# Patient Record
Sex: Male | Born: 1955 | Race: Black or African American | Hispanic: No | Marital: Single | Smoking: Former smoker
Health system: Southern US, Community
[De-identification: ages and names within clinical notes are randomized; demographics above are authoritative.]

## PROBLEM LIST (undated history)

## (undated) DIAGNOSIS — M12819 Other specific arthropathies, not elsewhere classified, unspecified shoulder: Secondary | ICD-10-CM

## (undated) DIAGNOSIS — I10 Essential (primary) hypertension: Secondary | ICD-10-CM

## (undated) DIAGNOSIS — K449 Diaphragmatic hernia without obstruction or gangrene: Secondary | ICD-10-CM

## (undated) DIAGNOSIS — R079 Chest pain, unspecified: Secondary | ICD-10-CM

## (undated) DIAGNOSIS — M1990 Unspecified osteoarthritis, unspecified site: Secondary | ICD-10-CM

## (undated) DIAGNOSIS — Z87442 Personal history of urinary calculi: Secondary | ICD-10-CM

## (undated) DIAGNOSIS — E119 Type 2 diabetes mellitus without complications: Secondary | ICD-10-CM

## (undated) DIAGNOSIS — E875 Hyperkalemia: Secondary | ICD-10-CM

## (undated) DIAGNOSIS — M25519 Pain in unspecified shoulder: Secondary | ICD-10-CM

## (undated) DIAGNOSIS — G252 Other specified forms of tremor: Secondary | ICD-10-CM

## (undated) DIAGNOSIS — J45909 Unspecified asthma, uncomplicated: Secondary | ICD-10-CM

## (undated) DIAGNOSIS — M75100 Unspecified rotator cuff tear or rupture of unspecified shoulder, not specified as traumatic: Secondary | ICD-10-CM

## (undated) DIAGNOSIS — I219 Acute myocardial infarction, unspecified: Secondary | ICD-10-CM

## (undated) DIAGNOSIS — K219 Gastro-esophageal reflux disease without esophagitis: Secondary | ICD-10-CM

## (undated) DIAGNOSIS — N189 Chronic kidney disease, unspecified: Secondary | ICD-10-CM

## (undated) HISTORY — PX: WISDOM TOOTH EXTRACTION: SHX21

## (undated) HISTORY — PX: CARDIAC CATHETERIZATION: SHX172

## (undated) HISTORY — DX: Chest pain, unspecified: R07.9

## (undated) HISTORY — DX: Other specific arthropathies, not elsewhere classified, unspecified shoulder: M12.819

## (undated) HISTORY — DX: Diaphragmatic hernia without obstruction or gangrene: K44.9

## (undated) HISTORY — DX: Pain in unspecified shoulder: M25.519

## (undated) HISTORY — DX: Unspecified rotator cuff tear or rupture of unspecified shoulder, not specified as traumatic: M75.100

## (undated) HISTORY — DX: Hyperkalemia: E87.5

## (undated) HISTORY — PX: HERNIA REPAIR: SHX51

## (undated) HISTORY — DX: Unspecified osteoarthritis, unspecified site: M19.90

## (undated) HISTORY — PX: ANKLE SURGERY: SHX546

## (undated) HISTORY — DX: Type 2 diabetes mellitus without complications: E11.9

---

## 2019-06-18 ENCOUNTER — Other Ambulatory Visit: Payer: Self-pay

## 2019-06-18 DIAGNOSIS — Z20822 Contact with and (suspected) exposure to covid-19: Secondary | ICD-10-CM

## 2019-06-25 LAB — NOVEL CORONAVIRUS, NAA: SARS-CoV-2, NAA: NOT DETECTED

## 2019-09-25 ENCOUNTER — Other Ambulatory Visit: Payer: Self-pay

## 2019-09-25 DIAGNOSIS — Z20822 Contact with and (suspected) exposure to covid-19: Secondary | ICD-10-CM

## 2019-09-27 LAB — NOVEL CORONAVIRUS, NAA: SARS-CoV-2, NAA: NOT DETECTED

## 2021-01-05 DIAGNOSIS — E669 Obesity, unspecified: Secondary | ICD-10-CM | POA: Diagnosis not present

## 2021-01-05 DIAGNOSIS — N529 Male erectile dysfunction, unspecified: Secondary | ICD-10-CM | POA: Diagnosis not present

## 2021-01-05 DIAGNOSIS — Z6830 Body mass index (BMI) 30.0-30.9, adult: Secondary | ICD-10-CM | POA: Diagnosis not present

## 2021-01-05 DIAGNOSIS — E7800 Pure hypercholesterolemia, unspecified: Secondary | ICD-10-CM | POA: Diagnosis not present

## 2021-01-05 DIAGNOSIS — E119 Type 2 diabetes mellitus without complications: Secondary | ICD-10-CM | POA: Diagnosis not present

## 2021-01-05 DIAGNOSIS — G252 Other specified forms of tremor: Secondary | ICD-10-CM | POA: Diagnosis not present

## 2021-01-05 DIAGNOSIS — R351 Nocturia: Secondary | ICD-10-CM | POA: Diagnosis not present

## 2021-01-05 DIAGNOSIS — I10 Essential (primary) hypertension: Secondary | ICD-10-CM | POA: Diagnosis not present

## 2021-01-05 DIAGNOSIS — K219 Gastro-esophageal reflux disease without esophagitis: Secondary | ICD-10-CM | POA: Diagnosis not present

## 2021-08-11 DIAGNOSIS — E119 Type 2 diabetes mellitus without complications: Secondary | ICD-10-CM | POA: Diagnosis not present

## 2021-08-11 DIAGNOSIS — H524 Presbyopia: Secondary | ICD-10-CM | POA: Diagnosis not present

## 2021-08-11 DIAGNOSIS — H01004 Unspecified blepharitis left upper eyelid: Secondary | ICD-10-CM | POA: Diagnosis not present

## 2021-08-11 DIAGNOSIS — H25013 Cortical age-related cataract, bilateral: Secondary | ICD-10-CM | POA: Diagnosis not present

## 2021-08-11 DIAGNOSIS — H18413 Arcus senilis, bilateral: Secondary | ICD-10-CM | POA: Diagnosis not present

## 2021-08-11 DIAGNOSIS — H01001 Unspecified blepharitis right upper eyelid: Secondary | ICD-10-CM | POA: Diagnosis not present

## 2021-08-11 DIAGNOSIS — H2513 Age-related nuclear cataract, bilateral: Secondary | ICD-10-CM | POA: Diagnosis not present

## 2021-08-17 DIAGNOSIS — K219 Gastro-esophageal reflux disease without esophagitis: Secondary | ICD-10-CM | POA: Diagnosis not present

## 2021-08-17 DIAGNOSIS — I10 Essential (primary) hypertension: Secondary | ICD-10-CM | POA: Diagnosis not present

## 2021-08-17 DIAGNOSIS — M4800 Spinal stenosis, site unspecified: Secondary | ICD-10-CM | POA: Diagnosis not present

## 2021-08-17 DIAGNOSIS — N400 Enlarged prostate without lower urinary tract symptoms: Secondary | ICD-10-CM | POA: Diagnosis not present

## 2021-08-17 DIAGNOSIS — K08409 Partial loss of teeth, unspecified cause, unspecified class: Secondary | ICD-10-CM | POA: Diagnosis not present

## 2021-08-17 DIAGNOSIS — E119 Type 2 diabetes mellitus without complications: Secondary | ICD-10-CM | POA: Diagnosis not present

## 2021-08-17 DIAGNOSIS — M1990 Unspecified osteoarthritis, unspecified site: Secondary | ICD-10-CM | POA: Diagnosis not present

## 2021-08-17 DIAGNOSIS — I252 Old myocardial infarction: Secondary | ICD-10-CM | POA: Diagnosis not present

## 2021-08-17 DIAGNOSIS — E785 Hyperlipidemia, unspecified: Secondary | ICD-10-CM | POA: Diagnosis not present

## 2021-08-17 DIAGNOSIS — R69 Illness, unspecified: Secondary | ICD-10-CM | POA: Diagnosis not present

## 2021-08-18 DIAGNOSIS — K4090 Unilateral inguinal hernia, without obstruction or gangrene, not specified as recurrent: Secondary | ICD-10-CM | POA: Diagnosis not present

## 2021-08-18 DIAGNOSIS — I2510 Atherosclerotic heart disease of native coronary artery without angina pectoris: Secondary | ICD-10-CM | POA: Diagnosis not present

## 2021-08-18 DIAGNOSIS — M75101 Unspecified rotator cuff tear or rupture of right shoulder, not specified as traumatic: Secondary | ICD-10-CM | POA: Diagnosis not present

## 2021-08-18 DIAGNOSIS — Z0189 Encounter for other specified special examinations: Secondary | ICD-10-CM | POA: Diagnosis not present

## 2021-08-18 DIAGNOSIS — N401 Enlarged prostate with lower urinary tract symptoms: Secondary | ICD-10-CM | POA: Diagnosis not present

## 2021-08-18 DIAGNOSIS — G250 Essential tremor: Secondary | ICD-10-CM | POA: Diagnosis not present

## 2021-08-18 DIAGNOSIS — Z86010 Personal history of colonic polyps: Secondary | ICD-10-CM | POA: Diagnosis not present

## 2021-08-18 DIAGNOSIS — I10 Essential (primary) hypertension: Secondary | ICD-10-CM | POA: Diagnosis not present

## 2021-08-18 DIAGNOSIS — E1169 Type 2 diabetes mellitus with other specified complication: Secondary | ICD-10-CM | POA: Diagnosis not present

## 2021-08-18 DIAGNOSIS — E782 Mixed hyperlipidemia: Secondary | ICD-10-CM | POA: Diagnosis not present

## 2021-09-18 DIAGNOSIS — E1169 Type 2 diabetes mellitus with other specified complication: Secondary | ICD-10-CM | POA: Diagnosis not present

## 2021-09-18 DIAGNOSIS — N401 Enlarged prostate with lower urinary tract symptoms: Secondary | ICD-10-CM | POA: Diagnosis not present

## 2021-09-18 DIAGNOSIS — I2510 Atherosclerotic heart disease of native coronary artery without angina pectoris: Secondary | ICD-10-CM | POA: Diagnosis not present

## 2021-09-20 DIAGNOSIS — E782 Mixed hyperlipidemia: Secondary | ICD-10-CM | POA: Diagnosis not present

## 2021-09-20 DIAGNOSIS — E1165 Type 2 diabetes mellitus with hyperglycemia: Secondary | ICD-10-CM | POA: Diagnosis not present

## 2021-09-20 DIAGNOSIS — I10 Essential (primary) hypertension: Secondary | ICD-10-CM | POA: Diagnosis not present

## 2021-09-20 DIAGNOSIS — E1169 Type 2 diabetes mellitus with other specified complication: Secondary | ICD-10-CM | POA: Diagnosis not present

## 2021-09-20 DIAGNOSIS — G250 Essential tremor: Secondary | ICD-10-CM | POA: Diagnosis not present

## 2021-09-20 DIAGNOSIS — I2510 Atherosclerotic heart disease of native coronary artery without angina pectoris: Secondary | ICD-10-CM | POA: Diagnosis not present

## 2021-09-20 DIAGNOSIS — Z86010 Personal history of colonic polyps: Secondary | ICD-10-CM | POA: Diagnosis not present

## 2021-09-20 DIAGNOSIS — M75101 Unspecified rotator cuff tear or rupture of right shoulder, not specified as traumatic: Secondary | ICD-10-CM | POA: Diagnosis not present

## 2021-09-20 DIAGNOSIS — N401 Enlarged prostate with lower urinary tract symptoms: Secondary | ICD-10-CM | POA: Diagnosis not present

## 2021-09-20 DIAGNOSIS — K4090 Unilateral inguinal hernia, without obstruction or gangrene, not specified as recurrent: Secondary | ICD-10-CM | POA: Diagnosis not present

## 2021-10-24 DIAGNOSIS — M19111 Post-traumatic osteoarthritis, right shoulder: Secondary | ICD-10-CM | POA: Diagnosis not present

## 2021-10-24 DIAGNOSIS — M19112 Post-traumatic osteoarthritis, left shoulder: Secondary | ICD-10-CM | POA: Diagnosis not present

## 2021-12-20 DIAGNOSIS — I2510 Atherosclerotic heart disease of native coronary artery without angina pectoris: Secondary | ICD-10-CM | POA: Diagnosis not present

## 2021-12-20 DIAGNOSIS — E1169 Type 2 diabetes mellitus with other specified complication: Secondary | ICD-10-CM | POA: Diagnosis not present

## 2021-12-21 NOTE — H&P (Signed)
°  Patient's anticipated LOS is less than 2 midnights, meeting these requirements: - Younger than 40 - Lives within 1 hour of care - Has a competent adult at home to recover with post-op recover - NO history of  - Chronic pain requiring opiods  - Diabetes  - Coronary Artery Disease  - Heart failure  - Heart attack  - Stroke  - DVT/VTE  - Cardiac arrhythmia  - Respiratory Failure/COPD  - Renal failure  - Anemia  - Advanced Liver disease     Kristopher Gilmore is an 67 y.o. male.    Chief Complaint: right shoulder pain  HPI: Pt is a 66 y.o. male complaining of right shoulder pain for multiple years. Pain had continually increased since the beginning. X-rays in the clinic show end-stage arthritic changes of the right shoulder. Pt has tried various conservative treatments which have failed to alleviate their symptoms, including injections and therapy. Various options are discussed with the patient. Risks, benefits and expectations were discussed with the patient. Patient understand the risks, benefits and expectations and wishes to proceed with surgery.   PCP:  No primary care provider on file.  D/C Plans: Home  PMH: No past medical history on file.    Social History:  has no history on file for tobacco use, alcohol use, and drug use.  Allergies:  Not on File  Medications: No current facility-administered medications for this encounter.   No current outpatient medications on file.    No results found for this or any previous visit (from the past 48 hour(s)). No results found.  ROS: Pain with rom of the right upper extremity  Physical Exam: Alert and oriented 66 y.o. male in no acute distress Cranial nerves 2-12 intact Cervical spine: full rom with no tenderness, nv intact distally Chest: active breath sounds bilaterally, no wheeze rhonchi or rales Heart: regular rate and rhythm, no murmur Abd: non tender non distended with active bowel sounds Hip is stable with rom   Right shoulder painful and weak rom Nv intact distally  No rashes or edema distally  Assessment/Plan Assessment: right shoulder cuff arthropathy  Plan:  Patient will undergo a right reverse total shoulder by Dr. Veverly Fells at [place]  Risks benefits and expectations were discussed with the patient. Patient understand risks, benefits and expectations and wishes to proceed. Preoperative templating of the joint replacement has been completed, documented, and submitted to the Operating Room personnel in order to optimize intra-operative equipment management.   Merla Riches PA-C, MPAS [facility] is now [employer] [address], [place], [place], [place] [address] Phone: [phone] [email or web address]   [employer]

## 2021-12-25 DIAGNOSIS — G250 Essential tremor: Secondary | ICD-10-CM | POA: Diagnosis not present

## 2021-12-25 DIAGNOSIS — E782 Mixed hyperlipidemia: Secondary | ICD-10-CM | POA: Diagnosis not present

## 2021-12-25 DIAGNOSIS — I2510 Atherosclerotic heart disease of native coronary artery without angina pectoris: Secondary | ICD-10-CM | POA: Diagnosis not present

## 2021-12-25 DIAGNOSIS — Z1211 Encounter for screening for malignant neoplasm of colon: Secondary | ICD-10-CM | POA: Diagnosis not present

## 2021-12-25 DIAGNOSIS — K4090 Unilateral inguinal hernia, without obstruction or gangrene, not specified as recurrent: Secondary | ICD-10-CM | POA: Diagnosis not present

## 2021-12-25 DIAGNOSIS — E1165 Type 2 diabetes mellitus with hyperglycemia: Secondary | ICD-10-CM | POA: Diagnosis not present

## 2021-12-25 DIAGNOSIS — N401 Enlarged prostate with lower urinary tract symptoms: Secondary | ICD-10-CM | POA: Diagnosis not present

## 2021-12-25 DIAGNOSIS — M75101 Unspecified rotator cuff tear or rupture of right shoulder, not specified as traumatic: Secondary | ICD-10-CM | POA: Diagnosis not present

## 2021-12-25 DIAGNOSIS — I10 Essential (primary) hypertension: Secondary | ICD-10-CM | POA: Diagnosis not present

## 2021-12-25 DIAGNOSIS — E1169 Type 2 diabetes mellitus with other specified complication: Secondary | ICD-10-CM | POA: Diagnosis not present

## 2021-12-25 DIAGNOSIS — N1831 Chronic kidney disease, stage 3a: Secondary | ICD-10-CM | POA: Diagnosis not present

## 2021-12-25 DIAGNOSIS — Z86010 Personal history of colonic polyps: Secondary | ICD-10-CM | POA: Diagnosis not present

## 2022-01-01 NOTE — Patient Instructions (Addendum)
DUE TO COVID-19 ONLY ONE VISITOR IS ALLOWED TO COME WITH YOU AND STAY IN THE WAITING ROOM ONLY DURING PRE OP AND PROCEDURE.   **NO VISITORS ARE ALLOWED IN THE SHORT STAY AREA OR RECOVERY ROOM!!**  IF YOU WILL BE ADMITTED INTO THE HOSPITAL YOU ARE ALLOWED ONLY TWO SUPPORT PEOPLE DURING VISITATION HOURS ONLY (7 AM -8PM)   The support person(s) must pass our screening, gel in and out, and wear a mask at all times, including in the patients room. Patients must also wear a mask when staff or their support person are in the room. Visitors GUEST BADGE MUST BE WORN VISIBLY  One adult visitor may remain with you overnight and MUST be in the room by 8 P.M.  No visitors under the age of 45. Any visitor under the age of 53 must be accompanied by an adult.    COVID SWAB TESTING MUST BE COMPLETED ON:  01/10/22 @ 9:00 AM   Site: [facility] [address] [place] Enter: Main Entrance have a seat in the waiting area to the right of main entrance (DO NOT CHECK IN AT ADMITTING!!!!!) Dial: [phone] to alert staff you have arrived  You are not required to quarantine, however you are required to wear a well-fitted mask when you are out and around people not in your household.  Hand Hygiene often Do NOT share personal items Notify your provider if you are in close contact with someone who has COVID or you develop fever 100.4 or greater, new onset of sneezing, cough, sore throat, shortness of breath or body aches.       Your procedure is scheduled on: 01/12/22    Report to [facility] Main Entrance    Report to admitting at 7:00 AM   Call this number if you have problems the morning of surgery [phone]   Do not eat food :After Midnight.   May have liquids until 6:45 AM day of surgery  CLEAR LIQUID DIET  Foods Allowed                                                                     Foods Excluded  Water, Black Coffee and tea (no milk or creamer)           liquids  that you cannot  Plain Jell-O in any flavor  (No red)                                    see through such as: Fruit ices (not with fruit pulp)                                            milk, soups, orange juice              Iced Popsicles (No red)  All solid food                                   Apple juices Sports drinks like Gatorade (No red) Lightly seasoned clear broth or consume(fat free) Sugar     The day of surgery:  Drink ONE (1) Pre-Surgery G2 at 6:45 AM the morning of surgery. Drink in one sitting. Do not sip.  This drink was given to you during your hospital  pre-op appointment visit. Nothing else to drink after completing the  Pre-Surgery G2.          If you have questions, please contact your surgeons office.     Oral Hygiene is also important to reduce your risk of infection.                                    Remember - BRUSH YOUR TEETH THE MORNING OF SURGERY WITH YOUR REGULAR TOOTHPASTE   Take these medicines the morning of surgery with A SIP OF WATER: Lipitor, Omeprazole, Propranolol, Flomax  DO NOT TAKE ANY ORAL DIABETIC MEDICATIONS DAY OF YOUR SURGERY  How to Manage Your Diabetes Before and After Surgery  Why is it important to control my blood sugar before and after surgery? Improving blood sugar levels before and after surgery helps healing and can limit problems. A way of improving blood sugar control is eating a healthy diet by:  Eating less sugar and carbohydrates  Increasing activity/exercise  Talking with your doctor about reaching your blood sugar goals High blood sugars (greater than 180 mg/dL) can raise your risk of infections and slow your recovery, so you will need to focus on controlling your diabetes during the weeks before surgery. Make sure that the doctor who takes care of your diabetes knows about your planned surgery including the date and location.  How do I manage my blood sugar before  surgery? Check your blood sugar at least 4 times a day, starting 2 days before surgery, to make sure that the level is not too high or low. Check your blood sugar the morning of your surgery when you wake up and every 2 hours until you get to the Short Stay unit. If your blood sugar is less than 70 mg/dL, you will need to treat for low blood sugar: Do not take insulin. Treat a low blood sugar (less than 70 mg/dL) with  cup of clear juice (cranberry or apple), 4 glucose tablets, OR glucose gel. Recheck blood sugar in 15 minutes after treatment (to make sure it is greater than 70 mg/dL). If your blood sugar is not greater than 70 mg/dL on recheck, call [phone] for further instructions. Report your blood sugar to the short stay nurse when you get to Short Stay.  If you are admitted to the hospital after surgery: Your blood sugar will be checked by the staff and you will probably be given insulin after surgery (instead of oral diabetes medicines) to make sure you have good blood sugar levels. The goal for blood sugar control after surgery is 80-180 mg/dL.   WHAT DO I DO ABOUT MY DIABETES MEDICATION?  Do not take oral diabetes medicines (pills) the morning of surgery.  THE DAY BEFORE SURGERY, do not take evening dose of Glipizide       THE MORNING OF SURGERY, do not take Glipizide  Reviewed and Endorsed by [facility] Patient Education Committee, August 2015                               You may not have any metal on your body including jewelry, and body piercing             Do not wear lotions, powders, cologne, or deodorant              Men may shave face and neck.   Do not bring valuables to the hospital. [facility] IS NOT             RESPONSIBLE   FOR VALUABLES.   Bring small overnight bag day of surgery.   Special Instructions: Bring a copy of your healthcare power of attorney and living will documents         the day of surgery if you haven't scanned them before.               Please read over the following fact sheets you were given: IF YOU HAVE QUESTIONS ABOUT YOUR PRE-OP INSTRUCTIONS PLEASE CALL [phone]- [facility] - Preparing for Surgery Before surgery, you can play an important role.  Because skin is not sterile, your skin needs to be as free of germs as possible.  You can reduce the number of germs on your skin by washing with CHG (chlorahexidine gluconate) soap before surgery.  CHG is an antiseptic cleaner which kills germs and bonds with the skin to continue killing germs even after washing. Please DO NOT use if you have an allergy to CHG or antibacterial soaps.  If your skin becomes reddened/irritated stop using the CHG and inform your nurse when you arrive at Short Stay. Do not shave (including legs and underarms) for at least 48 hours prior to the first CHG shower.  You may shave your face/neck.  Please follow these instructions carefully:  1.  Shower with CHG Soap the night before surgery and the  morning of surgery.  2.  If you choose to wash your hair, wash your hair first as usual with your normal  shampoo.  3.  After you shampoo, rinse your hair and body thoroughly to remove the shampoo.                             4.  Use CHG as you would any other liquid soap.  You can apply chg directly to the skin and wash.  Gently with a scrungie or clean washcloth.  5.  Apply the CHG Soap to your body ONLY FROM THE NECK DOWN.   Do   not use on face/ open                           Wound or open sores. Avoid contact with eyes, ears mouth and   genitals (private parts).                       Wash face,  Genitals (private parts) with your normal soap.             6.  Wash thoroughly, paying special attention to the area where your    surgery  will be performed.  7.  Thoroughly rinse your body with warm water from the neck  down.  8.  DO NOT shower/wash with your normal soap after using and rinsing off the CHG Soap.                9.  Pat yourself  dry with a clean towel.            10.  Wear clean pajamas.            11.  Place clean sheets on your bed the night of your first shower and do not  sleep with pets. Day of Surgery : Do not apply any lotions/deodorants the morning of surgery.  Please wear clean clothes to the hospital/surgery center.  FAILURE TO FOLLOW THESE INSTRUCTIONS MAY RESULT IN THE CANCELLATION OF YOUR SURGERY  PATIENT SIGNATURE_________________________________  NURSE SIGNATURE__________________________________  ________________________________________________________________________   Rogelia MireIncentive Spirometer  An incentive spirometer is a tool that can help keep your lungs clear and active. This tool measures how well you are filling your lungs with each breath. Taking long deep breaths may help reverse or decrease the chance of developing breathing (pulmonary) problems (especially infection) following: A long period of time when you are unable to move or be active. BEFORE THE PROCEDURE  If the spirometer includes an indicator to show your best effort, your nurse or respiratory therapist will set it to a desired goal. If possible, sit up straight or lean slightly forward. Try not to slouch. Hold the incentive spirometer in an upright position. INSTRUCTIONS FOR USE  Sit on the edge of your bed if possible, or sit up as far as you can in bed or on a chair. Hold the incentive spirometer in an upright position. Breathe out normally. Place the mouthpiece in your mouth and seal your lips tightly around it. Breathe in slowly and as deeply as possible, raising the piston or the ball toward the top of the column. Hold your breath for 3-5 seconds or for as long as possible. Allow the piston or ball to fall to the bottom of the column. Remove the mouthpiece from your mouth and breathe out normally. Rest for a few seconds and repeat Steps 1 through 7 at least 10 times every 1-2 hours when you are awake. Take your time and take  a few normal breaths between deep breaths. The spirometer may include an indicator to show your best effort. Use the indicator as a goal to work toward during each repetition. After each set of 10 deep breaths, practice coughing to be sure your lungs are clear. If you have an incision (the cut made at the time of surgery), support your incision when coughing by placing a pillow or rolled up towels firmly against it. Once you are able to get out of bed, walk around indoors and cough well. You may stop using the incentive spirometer when instructed by your caregiver.  RISKS AND COMPLICATIONS Take your time so you do not get dizzy or light-headed. If you are in pain, you may need to take or ask for pain medication before doing incentive spirometry. It is harder to take a deep breath if you are having pain. AFTER USE Rest and breathe slowly and easily. It can be helpful to keep track of a log of your progress. Your caregiver can provide you with a simple table to help with this. If you are using the spirometer at home, follow these instructions: SEEK MEDICAL CARE IF:  You are having difficultly using the spirometer. You have trouble using the spirometer as often as instructed.  Your pain medication is not giving enough relief while using the spirometer. You develop fever of 100.5 F (38.1 C) or higher. SEEK IMMEDIATE MEDICAL CARE IF:  You cough up bloody sputum that had not been present before. You develop fever of 102 F (38.9 C) or greater. You develop worsening pain at or near the incision site. MAKE SURE YOU:  Understand these instructions. Will watch your condition. Will get help right away if you are not doing well or get worse. Document Released: 04/15/2007 Document Revised: 02/25/2012 Document Reviewed: 06/16/2007 ExitCare Patient Information 2014 [place] [employer].   ________________________________________________________________________  [facility]- Preparing for Total Shoulder  Arthroplasty    Before surgery, you can play an important role. Because skin is not sterile, your skin needs to be as free of germs as possible. You can reduce the number of germs on your skin by using the following products. Benzoyl Peroxide Gel Reduces the number of germs present on the skin Applied twice a day to shoulder area starting two days before surgery    ==================================================================  Please follow these instructions carefully:  BENZOYL PEROXIDE 5% GEL  Please do not use if you have an allergy to benzoyl peroxide.   If your skin becomes reddened/irritated stop using the benzoyl peroxide.  Starting two days before surgery, apply as follows: Apply benzoyl peroxide in the morning and at night. Apply after taking a shower. If you are not taking a shower clean entire shoulder front, back, and side along with the armpit with a clean wet washcloth.  Place a quarter-sized dollop on your shoulder and rub in thoroughly, making sure to cover the front, back, and side of your shoulder, along with the armpit.   2 days before ____ AM   ____ PM              1 day before ____ AM   ____ PM                         Do this twice a day for two days.  (Last application is the night before surgery, AFTER using the CHG soap as described below).  Do NOT apply benzoyl peroxide gel on the day of surgery.

## 2022-01-01 NOTE — Progress Notes (Addendum)
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 Pt will call PCP Last Dose:  Activity level: Can go up a flight of stairs and perform activities of daily living without stopping and without symptoms of chest pain or shortness of breath.    Anesthesia review: A1C 7.7 at PAT appointment, MI, CKD, HTN, DM2, BP at PAT 168/104 and 162/103. Pr denied symptoms reports being very stressed lately. He was supposed to increase his Propranolol to 2 pills a day and has not done this yet. Obtained EKG and instructed pt to start taking his medication as prescribed and to check BP at home. If continues to be elevated let PCP know.   Patient denies shortness of breath, fever, cough and chest pain at PAT appointment   Patient verbalized understanding of instructions that were given to them at the PAT appointment. Patient was also instructed that they will need to review over the PAT instructions again at home before surgery.

## 2022-01-02 ENCOUNTER — Other Ambulatory Visit: Payer: Self-pay

## 2022-01-02 ENCOUNTER — Encounter (HOSPITAL_COMMUNITY): Payer: Self-pay

## 2022-01-02 ENCOUNTER — Encounter (HOSPITAL_COMMUNITY): Payer: Self-pay | Admitting: Anesthesiology

## 2022-01-02 ENCOUNTER — Encounter (HOSPITAL_COMMUNITY): Payer: Self-pay | Admitting: Physician Assistant

## 2022-01-02 ENCOUNTER — Encounter (HOSPITAL_COMMUNITY)
Admission: RE | Admit: 2022-01-02 | Discharge: 2022-01-02 | Disposition: A | Discharge status: Home / Self Care | Payer: Medicare HMO | Source: Ambulatory Visit | Attending: Orthopedic Surgery | Admitting: Orthopedic Surgery

## 2022-01-02 VITALS — BP 162/103 | HR 58 | Temp 98.6°F | Resp 14 | Ht 70.0 in | Wt 225.9 lb

## 2022-01-02 DIAGNOSIS — I2510 Atherosclerotic heart disease of native coronary artery without angina pectoris: Secondary | ICD-10-CM | POA: Diagnosis not present

## 2022-01-02 DIAGNOSIS — E119 Type 2 diabetes mellitus without complications: Secondary | ICD-10-CM | POA: Diagnosis not present

## 2022-01-02 DIAGNOSIS — Z01818 Encounter for other preprocedural examination: Secondary | ICD-10-CM | POA: Diagnosis not present

## 2022-01-02 HISTORY — DX: Type 2 diabetes mellitus without complications: E11.9

## 2022-01-02 HISTORY — DX: Unspecified asthma, uncomplicated: J45.909

## 2022-01-02 HISTORY — DX: Essential (primary) hypertension: I10

## 2022-01-02 HISTORY — DX: Gastro-esophageal reflux disease without esophagitis: K21.9

## 2022-01-02 HISTORY — DX: Acute myocardial infarction, unspecified: I21.9

## 2022-01-02 HISTORY — DX: Other specified forms of tremor: G25.2

## 2022-01-02 HISTORY — DX: Personal history of urinary calculi: Z87.442

## 2022-01-02 HISTORY — DX: Chronic kidney disease, unspecified: N18.9

## 2022-01-02 LAB — HEMOGLOBIN A1C
Hgb A1c MFr Bld: 7.7 % — ABNORMAL HIGH (ref 4.8–5.6)
Mean Plasma Glucose: 174.29 mg/dL

## 2022-01-02 LAB — CBC
HCT: 38.9 % — ABNORMAL LOW (ref 39.0–52.0)
Hemoglobin: 12.9 g/dL — ABNORMAL LOW (ref 13.0–17.0)
MCH: 30.3 pg (ref 26.0–34.0)
MCHC: 33.2 g/dL (ref 30.0–36.0)
MCV: 91.3 fL (ref 80.0–100.0)
Platelets: 163 10*3/uL (ref 150–400)
RBC: 4.26 MIL/uL (ref 4.22–5.81)
RDW: 13.2 % (ref 11.5–15.5)
WBC: 7.2 10*3/uL (ref 4.0–10.5)
nRBC: 0 % (ref 0.0–0.2)

## 2022-01-02 LAB — BASIC METABOLIC PANEL
Anion gap: 4 — ABNORMAL LOW (ref 5–15)
BUN: 11 mg/dL (ref 8–23)
CO2: 28 mmol/L (ref 22–32)
Calcium: 9.2 mg/dL (ref 8.9–10.3)
Chloride: 103 mmol/L (ref 98–111)
Creatinine, Ser: 1.53 mg/dL — ABNORMAL HIGH (ref 0.61–1.24)
GFR, Estimated: 50 mL/min — ABNORMAL LOW (ref >60)
Glucose, Bld: 159 mg/dL — ABNORMAL HIGH (ref 70–99)
Potassium: 4.5 mmol/L (ref 3.5–5.1)
Sodium: 135 mmol/L (ref 135–145)

## 2022-01-02 LAB — GLUCOSE, CAPILLARY: Glucose-Capillary: 167 mg/dL — ABNORMAL HIGH (ref 70–99)

## 2022-01-02 LAB — SURGICAL PCR SCREEN
MRSA, PCR: NEGATIVE
Staphylococcus aureus: NEGATIVE

## 2022-01-02 NOTE — Progress Notes (Signed)
A1C 7.7 results routed to Dr. Ranell Patrick.

## 2022-01-09 ENCOUNTER — Encounter (HOSPITAL_COMMUNITY): Payer: Self-pay

## 2022-01-09 NOTE — Progress Notes (Signed)
Anesthesia Chart Review   Case: [id number] Date/Time: 01/12/22 0930   Procedure: REVERSE SHOULDER ARTHROPLASTY (Right: Shoulder)   Anesthesia type: General   Pre-op diagnosis: right shoulder rotator cuff tear arthropathy   Location: [facility] ROOM 06 / [facility]   Surgeons: Netta Cedars, MD       DISCUSSION:66 y.o. former smoker with h/o HTN, DM II, CKD, MI 1991, right shoulder rotator cuff tear scheduled for above procedure 01/12/2022 with Dr. Netta Cedars.    MI 1991, per PCP notes no stents, pericarditis at this time.  He has followed with PCP since that time.  Last seen by PCP 12/25/21. Lisinopril increased at this visit. PCP aware of upcoming surgery, no contraindications. Elevated BP at PAT visit, pt reports he had not increased his lisinopril yet, advised to do so.  Evaluate BP DOS.   A1C 7.7, Dr. Veverly Fells office made aware.   EKG forwarded to PCP for review.  VS: BP (!) 162/103    Pulse (!) 58    Temp 37 C (Oral)    Resp 14    Ht 5\' 10"  (1.778 m)    Wt 102.5 kg    SpO2 100%    BMI 32.41 kg/m   PROVIDERS: Celene Squibb, MD is PCP    LABS:  forwarded to Dr. Veverly Fells (all labs ordered are listed, but only abnormal results are displayed)  Labs Reviewed  HEMOGLOBIN A1C - Abnormal; Notable for the following components:      Result Value   Hgb A1c MFr Bld 7.7 (*)    All other components within normal limits  BASIC METABOLIC PANEL - Abnormal; Notable for the following components:   Glucose, Bld 159 (*)    Creatinine, Ser 1.53 (*)    GFR, Estimated 50 (*)    Anion gap 4 (*)    All other components within normal limits  CBC - Abnormal; Notable for the following components:   Hemoglobin 12.9 (*)    HCT 38.9 (*)    All other components within normal limits  GLUCOSE, CAPILLARY - Abnormal; Notable for the following components:   Glucose-Capillary 167 (*)    All other components within normal limits  SURGICAL PCR SCREEN     IMAGES:   EKG: 01/02/2022 Rate 55 bpm  Sinus  bradycardia Increased R/S ratio in V1, consider early transition or posterior infarct ST T wave changes consistent with early repolarization  CV:  Past Medical History:  Diagnosis Date   Asthma    childhood   Chronic kidney disease    Coarse tremors    Diabetes mellitus without complication (HCC)    GERD (gastroesophageal reflux disease)    History of kidney stones    Hypertension     Past Surgical History:  Procedure Laterality Date   ANKLE SURGERY Right    CARDIAC CATHETERIZATION     HERNIA REPAIR     WISDOM TOOTH EXTRACTION      MEDICATIONS:  aspirin EC 81 MG tablet   atorvastatin (LIPITOR) 10 MG tablet   glipiZIDE (GLUCOTROL XL) 5 MG 24 hr tablet   lisinopril (ZESTRIL) 40 MG tablet   omeprazole (PRILOSEC) 20 MG capsule   propranolol (INDERAL) 10 MG tablet   tamsulosin (FLOMAX) 0.4 MG CAPS capsule   No current facility-administered medications for this encounter.    Kristopher Felix Ward, PA-C [facility] Pre-Surgical Testing [phone]

## 2022-01-10 ENCOUNTER — Encounter (HOSPITAL_COMMUNITY)
Admission: RE | Admit: 2022-01-10 | Discharge: 2022-01-10 | Disposition: A | Discharge status: Home / Self Care | Payer: Medicare HMO | Source: Ambulatory Visit | Attending: Orthopedic Surgery | Admitting: Orthopedic Surgery

## 2022-01-10 ENCOUNTER — Other Ambulatory Visit: Payer: Self-pay

## 2022-01-10 DIAGNOSIS — Z20822 Contact with and (suspected) exposure to covid-19: Secondary | ICD-10-CM | POA: Insufficient documentation

## 2022-01-10 DIAGNOSIS — Z01812 Encounter for preprocedural laboratory examination: Secondary | ICD-10-CM | POA: Diagnosis not present

## 2022-01-10 LAB — SARS CORONAVIRUS 2 (TAT 6-24 HRS): SARS Coronavirus 2: NEGATIVE

## 2022-01-11 ENCOUNTER — Encounter (HOSPITAL_COMMUNITY): Payer: Self-pay | Admitting: Orthopedic Surgery

## 2022-01-12 ENCOUNTER — Encounter (HOSPITAL_COMMUNITY): Admission: RE | Payer: Self-pay | Source: Ambulatory Visit

## 2022-01-12 ENCOUNTER — Ambulatory Visit (HOSPITAL_COMMUNITY): Admission: RE | Admit: 2022-01-12 | Payer: Medicare HMO | Source: Ambulatory Visit | Admitting: Orthopedic Surgery

## 2022-01-12 SURGERY — ARTHROPLASTY, SHOULDER, TOTAL, REVERSE
Anesthesia: General | Site: Shoulder | Laterality: Right

## 2022-02-28 ENCOUNTER — Encounter (HOSPITAL_COMMUNITY): Payer: Self-pay

## 2022-02-28 ENCOUNTER — Emergency Department (HOSPITAL_COMMUNITY): Payer: Medicare HMO

## 2022-02-28 ENCOUNTER — Emergency Department (HOSPITAL_COMMUNITY)
Admission: EM | Admit: 2022-02-28 | Discharge: 2022-02-28 | Disposition: A | Discharge status: Home / Self Care | Payer: Medicare HMO | Attending: Emergency Medicine | Admitting: Emergency Medicine

## 2022-02-28 ENCOUNTER — Other Ambulatory Visit: Payer: Self-pay

## 2022-02-28 DIAGNOSIS — Z79899 Other long term (current) drug therapy: Secondary | ICD-10-CM | POA: Diagnosis not present

## 2022-02-28 DIAGNOSIS — R7989 Other specified abnormal findings of blood chemistry: Secondary | ICD-10-CM | POA: Insufficient documentation

## 2022-02-28 DIAGNOSIS — Z7984 Long term (current) use of oral hypoglycemic drugs: Secondary | ICD-10-CM | POA: Insufficient documentation

## 2022-02-28 DIAGNOSIS — R079 Chest pain, unspecified: Secondary | ICD-10-CM | POA: Diagnosis not present

## 2022-02-28 DIAGNOSIS — J45909 Unspecified asthma, uncomplicated: Secondary | ICD-10-CM | POA: Insufficient documentation

## 2022-02-28 DIAGNOSIS — R0789 Other chest pain: Secondary | ICD-10-CM | POA: Diagnosis not present

## 2022-02-28 DIAGNOSIS — J439 Emphysema, unspecified: Secondary | ICD-10-CM | POA: Diagnosis not present

## 2022-02-28 DIAGNOSIS — I10 Essential (primary) hypertension: Secondary | ICD-10-CM | POA: Diagnosis not present

## 2022-02-28 DIAGNOSIS — E119 Type 2 diabetes mellitus without complications: Secondary | ICD-10-CM | POA: Insufficient documentation

## 2022-02-28 DIAGNOSIS — Z7982 Long term (current) use of aspirin: Secondary | ICD-10-CM | POA: Insufficient documentation

## 2022-02-28 DIAGNOSIS — K449 Diaphragmatic hernia without obstruction or gangrene: Secondary | ICD-10-CM | POA: Diagnosis not present

## 2022-02-28 DIAGNOSIS — J9811 Atelectasis: Secondary | ICD-10-CM | POA: Diagnosis not present

## 2022-02-28 LAB — CBC
HCT: 39.7 % (ref 39.0–52.0)
Hemoglobin: 12.8 g/dL — ABNORMAL LOW (ref 13.0–17.0)
MCH: 29.5 pg (ref 26.0–34.0)
MCHC: 32.2 g/dL (ref 30.0–36.0)
MCV: 91.5 fL (ref 80.0–100.0)
Platelets: 189 10*3/uL (ref 150–400)
RBC: 4.34 MIL/uL (ref 4.22–5.81)
RDW: 12.4 % (ref 11.5–15.5)
WBC: 9.7 10*3/uL (ref 4.0–10.5)
nRBC: 0 % (ref 0.0–0.2)

## 2022-02-28 LAB — TROPONIN I (HIGH SENSITIVITY)
Troponin I (High Sensitivity): 4 ng/L (ref <18)
Troponin I (High Sensitivity): 4 ng/L (ref <18)

## 2022-02-28 LAB — BASIC METABOLIC PANEL
Anion gap: 9 (ref 5–15)
BUN: 9 mg/dL (ref 8–23)
CO2: 24 mmol/L (ref 22–32)
Calcium: 8.9 mg/dL (ref 8.9–10.3)
Chloride: 104 mmol/L (ref 98–111)
Creatinine, Ser: 1.32 mg/dL — ABNORMAL HIGH (ref 0.61–1.24)
GFR, Estimated: 59 mL/min — ABNORMAL LOW (ref >60)
Glucose, Bld: 157 mg/dL — ABNORMAL HIGH (ref 70–99)
Potassium: 4.2 mmol/L (ref 3.5–5.1)
Sodium: 137 mmol/L (ref 135–145)

## 2022-02-28 LAB — D-DIMER, QUANTITATIVE: D-Dimer, Quant: 1.04 ug/mL-FEU — ABNORMAL HIGH (ref 0.00–0.50)

## 2022-02-28 MED ORDER — IOHEXOL 350 MG/ML SOLN
100.0000 mL | Freq: Once | INTRAVENOUS | Status: AC | PRN
  Administered 2022-02-28: 80 mL via INTRAVENOUS

## 2022-02-28 MED ORDER — FAMOTIDINE IN NACL 20-0.9 MG/50ML-% IV SOLN
20.0000 mg | Freq: Once | INTRAVENOUS | Status: AC
  Administered 2022-02-28: 20 mg via INTRAVENOUS
  Filled 2022-02-28: qty 50

## 2022-02-28 MED ORDER — FENTANYL CITRATE PF 50 MCG/ML IJ SOSY
50.0000 ug | PREFILLED_SYRINGE | Freq: Once | INTRAMUSCULAR | Status: AC
  Administered 2022-02-28: 50 ug via INTRAVENOUS
  Filled 2022-02-28: qty 1

## 2022-02-28 MED ORDER — SUCRALFATE 1 GM/10ML PO SUSP
1.0000 g | Freq: Once | ORAL | Status: AC
  Administered 2022-02-28: 1 g via ORAL
  Filled 2022-02-28: qty 10

## 2022-02-28 MED ORDER — SUCRALFATE 1 G PO TABS: 1.0000 g | ORAL_TABLET | Freq: Three times a day (TID) | ORAL | 0 refills | Status: DC

## 2022-02-28 MED ORDER — PREDNISONE 20 MG PO TABS: 40.0000 mg | ORAL_TABLET | Freq: Every day | ORAL | 0 refills | Status: DC

## 2022-02-28 NOTE — ED Provider Notes (Signed)
?[facility] EMERGENCY DEPARTMENT ?Provider Note ? ? ?CSN: [id number] ?Arrival date & time: 02/28/22  1443 ? ?  ? ?History ? ?Chief Complaint  ?Patient presents with  ? Chest Pain  ? ? ?Kristopher Gilmore is a 66 y.o. male. ? ?HPI ?Patient presents with chest pressure.  He is accompanied by family who assists with the history. ?Pressure has been present on and off for the past 4 days, no clear alleviating, exacerbating, precipitating factors.  He saw his physician today and was sent here for evaluation.  He acknowledges multiple other medical issues, but states he has no history of cardiac disease.  He has been taking his medication as directed. ?  ? ?Home Medications ?Prior to Admission medications   ?Medication Sig Start Date End Date Taking? Authorizing Provider  ?aspirin EC 81 MG tablet Take 81 mg by mouth daily. Swallow whole.    [provider]  ?atorvastatin (LIPITOR) 10 MG tablet Take 10 mg by mouth daily. 09/28/21   [provider]  ?glipiZIDE (GLUCOTROL XL) 5 MG 24 hr tablet Take 5 mg by mouth daily.    [provider]  ?lisinopril (ZESTRIL) 40 MG tablet Take 40 mg by mouth daily. 12/25/21   [provider]  ?omeprazole (PRILOSEC) 20 MG capsule Take 20 mg by mouth daily. 07/01/21   [provider]  ?propranolol (INDERAL) 10 MG tablet Take 10 mg by mouth daily. 11/23/21   [provider]  ?tamsulosin (FLOMAX) 0.4 MG CAPS capsule Take 0.4 mg by mouth daily.    [provider]  ?   ? ?Allergies    ?Metformin   ? ?Review of Systems   ?Review of Systems  ?Constitutional:   ?     Per HPI, otherwise negative  ?HENT:    ?     Per HPI, otherwise negative  ?Respiratory:    ?     Per HPI, otherwise negative  ?Cardiovascular:   ?     Per HPI, otherwise negative  ?Gastrointestinal:  Negative for vomiting.  ?Endocrine:  ?     Negative aside from HPI  ?Genitourinary:   ?     Neg aside from HPI   ?Musculoskeletal:   ?     Per HPI, otherwise negative  ?Skin: Negative.    ?Neurological:  Negative for syncope.  ? ?Physical Exam ?Updated Vital Signs ?BP (!) 161/81 (BP Location: Right Arm)   Pulse (!) 55   Temp 97.9 ?F (36.6 ?C) (Oral)   Resp (!) 21   Ht 5\' 9"  (1.753 m)   Wt 102.1 kg   SpO2 100%   BMI 33.23 kg/m?  ?Physical Exam ?Vitals and nursing note reviewed.  ?Constitutional:   ?   General: He is not in acute distress. ?   Appearance: He is well-developed.  ?HENT:  ?   Head: Normocephalic and atraumatic.  ?Eyes:  ?   Conjunctiva/sclera: Conjunctivae normal.  ?Cardiovascular:  ?   Rate and Rhythm: Normal rate and regular rhythm.  ?Pulmonary:  ?   Effort: Pulmonary effort is normal. No respiratory distress.  ?   Breath sounds: No stridor.  ?Abdominal:  ?   General: There is no distension.  ?Skin: ?   General: Skin is warm and dry.  ?Neurological:  ?   Mental Status: He is alert and oriented to person, place, and time.  ? ? ?ED Results / Procedures / Treatments   ?Labs ?(all labs ordered are listed, but only abnormal results are displayed) ?  Labs Reviewed  ?BASIC METABOLIC PANEL - Abnormal; Notable for the following components:  ?    Result Value  ? Glucose, Bld 157 (*)   ? Creatinine, Ser 1.32 (*)   ? GFR, Estimated 59 (*)   ? All other components within normal limits  ?CBC - Abnormal; Notable for the following components:  ? Hemoglobin 12.8 (*)   ? All other components within normal limits  ?TROPONIN I (HIGH SENSITIVITY)  ?TROPONIN I (HIGH SENSITIVITY)  ? ? ?EKG ?EKG Interpretation ? ?Date/Time:  Wednesday February 28 2022 14:50:48 EDT ?Ventricular Rate:  62 ?PR Interval:  170 ?QRS Duration: 90 ?QT Interval:  396 ?QTC Calculation: 401 ?R Axis:   48 ?Text Interpretation: Normal sinus rhythm Normal ECG When compared with ECG of 02-Jan-2022 10:46, No significant change was found Confirmed by Gerhard Munch [phone]) on 02/28/2022 5:48:18 PM ? ?Radiology ?DG Chest 2 View ? ?Result Date: 02/28/2022 ?CLINICAL DATA:  Chest pain, mid chest pressure, and shortness of breath since Sunday,  history diabetes mellitus, hypertension, asthma EXAM: CHEST - 2 VIEW COMPARISON:  None FINDINGS: Normal heart size and pulmonary vascularity. Large hiatal hernia. Mild LEFT basilar atelectasis. Lungs otherwise clear. No pulmonary infiltrate, pleural effusion, or pneumothorax. Osseous structures unremarkable. IMPRESSION: Large hiatal hernia. Mild LEFT basilar atelectasis. Electronically Signed   By: [name] M.D.   On: 02/28/2022 15:05   ? ?Procedures ?Procedures  ? ? ?Medications Ordered in ED ?Medications - No data to display ? ?ED Course/ Medical Decision Making/ A&P ?This patient presents to the ED for concern of chest pressure, this involves an extensive number of treatment options, and is a complaint that carries with it a high risk of complications and morbidity.  The differential diagnosis includes ACS, PE, pneumonia, bronchitis ? ? ?Co morbidities that complicate the patient evaluation ? ?Age, obesity, hypertension, hypercholesterolemia ? ?Social Determinants of Health: ? ?Age ? ?Additional history obtained: ? ?Additional history and/or information obtained from wife ?External records from outside source obtained and reviewed including history of this illness, and details from physician office today, reviewed those notes as well ? ? ?After the initial evaluation, orders, including: Labs monitoring were initiated. ? ? ?Patient placed on Cardiac and Pulse-Oximetry Monitors. ?The patient was maintained on a cardiac monitor.  The cardiac monitored showed an rhythm of cardiac sinus 60 normal ?The patient was also maintained on pulse oximetry. The readings were typically 100% room air normal ? ? ?On repeat evaluation of the patient improved ? ?Lab Tests: ? ?I personally interpreted labs.  The pertinent results include: 2 normal troponin, no leukocytosis, slight elevation in creatinine ? ?Imaging Studies ordered: ? ?I independently visualized and interpreted imaging which showed no pneumonia ?I agree with the  radiologist interpretation ? ?11:17 PM ?Patient accompanied by his sister.  We discussed tonight's evaluation, and his pain is improved.  Beyond initial interventions he has received Pepcid, fluids, Carafate.  He has had some brief bursts of pain, but there is no evidence for abnormality when reviewing his rhythm strip during these episodes.  We discussed today's generally reassuring findings, no evidence for atypical ACS, PE, pneumonia.  Patient and sister comfortable with patient following up as an outpatient with his physician, and after we discussed the importance of doing so.  Patient discharged in stable condition. ? ? ?Final Clinical Impression(s) / ED Diagnoses ?Final diagnoses:  ?Atypical chest pain  ? ?  ?Gerhard Munch, MD ?02/28/22 2319 ? ?

## 2022-02-28 NOTE — Discharge Instructions (Signed)
As discussed, your evaluation today has been largely reassuring.  But, it is important that you monitor your condition carefully, and do not hesitate to return to the ED if you develop new, or concerning changes in your condition. ? ?Otherwise, please follow-up with your physician for appropriate ongoing care. ? ?

## 2022-02-28 NOTE — ED Triage Notes (Signed)
Pt presents to ED with complaints of mid chest pressure and shortness of breath started Sunday.  ?

## 2022-03-05 DIAGNOSIS — K449 Diaphragmatic hernia without obstruction or gangrene: Secondary | ICD-10-CM | POA: Diagnosis not present

## 2022-03-07 ENCOUNTER — Encounter (HOSPITAL_COMMUNITY): Payer: Self-pay | Admitting: Occupational Therapy

## 2022-03-07 ENCOUNTER — Other Ambulatory Visit: Payer: Self-pay

## 2022-03-07 ENCOUNTER — Ambulatory Visit (HOSPITAL_COMMUNITY): Payer: Medicare HMO | Attending: Orthopedic Surgery | Admitting: Occupational Therapy

## 2022-03-07 DIAGNOSIS — M25511 Pain in right shoulder: Secondary | ICD-10-CM | POA: Diagnosis not present

## 2022-03-07 DIAGNOSIS — R29898 Other symptoms and signs involving the musculoskeletal system: Secondary | ICD-10-CM | POA: Diagnosis not present

## 2022-03-07 DIAGNOSIS — M25611 Stiffness of right shoulder, not elsewhere classified: Secondary | ICD-10-CM | POA: Insufficient documentation

## 2022-03-07 DIAGNOSIS — G8929 Other chronic pain: Secondary | ICD-10-CM | POA: Insufficient documentation

## 2022-03-07 NOTE — Patient Instructions (Signed)
1) SHOULDER: Flexion On Table ? ? ?Place hands on towel placed on table, elbows straight. Lean forward with you upper body, pushing towel away from body.  _10__ reps per set, _3__ sets per day ? ?2) Abduction (Passive) ? ? ?With arm out to side, resting on towel placed on table with palm DOWN, keeping trunk away from table, lean to the side while pushing towel away from body.  ?Repeat _10___ times. Do _3___ sessions per day. ? ?Copyright ? [employer]. All rights reserved.  ? ? ? ?3) Internal Rotation (Assistive) ? ? ?Seated with elbow bent at right angle and held against side, slide arm on table surface in an inward arc keeping elbow anchored in place. ?Repeat __10__ times. Do __3__ sessions per day. ?Activity: Use this motion to brush crumbs off the table. ? ?Copyright ? [employer]. All rights reserved.  ? ? ? ?Scapular A/ROM ? ?1) Seated Row ? ? ?Sit up straight with elbows by your sides. Pull back with shoulders/elbows, keeping forearms straight, as if pulling back on the reins of a horse. Squeeze shoulder blades together. Repeat _10-15__times, __2-3__sets/day ? ? ? ?2) Shoulder Extension ? ? ? ?Sit up straight with both arms by your side, draw your arms back behind your waist. Keep your elbows straight. Repeat __10__times, __2-3__sets/day. ? ? ? ? ? ? ? ?

## 2022-03-07 NOTE — Therapy (Signed)
?OUTPATIENT OCCUPATIONAL THERAPY ORTHO EVALUATION ? ?Patient Name: Kristopher Gilmore ?MRN: [id number] ?DOB:[birth year], 66 y.o., male ?Today's Date: 03/07/2022 ? ?PCP: Celene Squibb, MD ?REFERRING PROVIDER: Netta Cedars, MD ? ? OT End of Session - 03/07/22 1106   ? ? Visit Number 1   ? Number of Visits 8   ? Date for OT Re-Evaluation 04/06/22   ? Authorization Type [employer] Medicare, co-pay $35   ? Progress Note Due on Visit 10   ? OT Start Time 1029   ? OT Stop Time 1100   ? OT Time Calculation (min) 31 min   ? Activity Tolerance Patient tolerated treatment well   ? Behavior During Therapy [facility] for tasks assessed/performed   ? ?  ?  ? ?  ? ? ?Past Medical History:  ?Diagnosis Date  ? Asthma   ? childhood  ? Chronic kidney disease   ? Coarse tremors   ? Diabetes mellitus without complication ([place])   ? GERD (gastroesophageal reflux disease)   ? History of kidney stones   ? Hypertension   ? ?Past Surgical History:  ?Procedure Laterality Date  ? ANKLE SURGERY Right   ? CARDIAC CATHETERIZATION    ? HERNIA REPAIR    ? WISDOM TOOTH EXTRACTION    ? ?There are no problems to display for this patient. ? ? ?ONSET DATE: chronic-years ? ?REFERRING DIAG: right shoulder pain ? ?THERAPY DIAG:  ?Chronic right shoulder pain ? ?Stiffness of right shoulder, not elsewhere classified ? ?Other symptoms and signs involving the musculoskeletal system ? ?SUBJECTIVE:  ? ?SUBJECTIVE STATEMENT: ?S: I tore it years ago.  ?Pt accompanied by: self ? ?PERTINENT HISTORY: Pt is a 66 y/o male with right shoulder pain, reports he tore his shoulder years ago. He reports tears in both shoulders. Reports he was told he needs therapy first before insurance will cover a repair.  ? ?PRECAUTIONS: None ? ?WEIGHT BEARING RESTRICTIONS No ? ?PAIN:  ?Are you having pain? No ? ?FALLS: Has patient fallen in last 6 months? No, Number of falls: 0 ? ? ?PLOF: Independent ? ?PATIENT GOALS To restore some ROM to prepare for upcoming surgery.  ? ?OBJECTIVE:  ? ?HAND DOMINANCE:  Right ? ?ADLs: ?Overall ADLs: Pt is having difficulty with dressing, bathing, reaching up and behind back, lifting items. Pt reports sleep is difficult due to waking from pain.  ? ? ?FUNCTIONAL OUTCOME MEASURES: ?FOTO: N/A ? ?UE ROM    ? ?Active ROM Right ?03/07/2022 Left ?03/07/2022  ?Shoulder flexion 114   ?Shoulder abduction 103   ?Shoulder internal rotation 90   ?Shoulder external rotation 35   ? ? ?   ?Passive ROM Right ?03/07/2022 Left ?03/07/2022  ?Shoulder flexion 151   ?Shoulder abduction 154   ?Shoulder internal rotation 90   ?Shoulder external rotation 35   ? ? ? ?UE MMT:    ? ?MMT Right ?03/07/2022 Left ?03/07/2022  ?Shoulder flexion 3-/5   ?Shoulder abduction 3-/5   ?Shoulder internal rotation 3/5   ?Shoulder external rotation 3-/5   ?(Blank rows = not tested) ? ? ? ?SENSATION: ?Pt reports numbness down right arm, this is constant ? ? ?COGNITION: ?Overall cognitive status: Within functional limits for tasks assessed ? ? ? ?PATIENT EDUCATION: ?Education details: table slides, scapular A/ROM ?Person educated: Patient ?Education method: Explanation, Demonstration, and Handouts ?Education comprehension: verbalized understanding and returned demonstration ? ? ?HOME EXERCISE PROGRAM: ?3/22: table slides, scapular A/ROM ? ?GOALS: ?Goals reviewed with patient? Yes ? ?SHORT TERM GOALS: Target  date: 04/04/2022 ? ?Pt will be provided with and educated on HEP to improve mobility of RUE required for use as dominant during ADL completion.  ? ?Goal status: INITIAL ? ?2.  Pt will increase RUE A/ROM to [facility] to improve ability to complete dressing and bathing tasks with minimal compensatory strategies.  ? ?Goal status: INITIAL ? ?3.  Pt will increase RUE strength to 4-/5 or greater to improve ability to lift lightweight items during functional tasks such as meal preparation. ?Goal status: INITIAL ? ?4.  Pt will decrease pain in RUE to 4/10 or less to improve ability to sleep for 2+ consecutive hours without waking due to pain.   ? ?Goal status: INITIAL ? ?5.  Pt will decrease RUE fascial restrictions from mod to min amounts to improve mobility required for functional reaching tasks.  ? ?Goal status: INITIAL ? ? ? ? ? ? ?ASSESSMENT: ? ?CLINICAL IMPRESSION: ?Patient is a 66 y.o. male who was seen today for occupational therapy evaluation for right shoulder pain. Pt reports he has had chronic shoulder pain and tore is RC in both shoulders years ago. He was scheduled for surgery on 01/12/22 however the day prior insurance called and required him to complete therapy first. Pt is limited in ADLs, functional reaching, and strength required for daily task completion.   ? ?PERFORMANCE DEFICITS in functional skills including ADLs, IADLs, sensation, ROM, strength, pain, fascial restrictions, muscle spasms, and UE functional use ? ?IMPAIRMENTS are limiting patient from ADLs, IADLs, rest and sleep, and leisure.  ? ?COMORBIDITIES may have co-morbidities  that affects occupational performance. Patient will benefit from skilled OT to address above impairments and improve overall function. ? ?MODIFICATION OR ASSISTANCE TO COMPLETE EVALUATION: No modification of tasks or assist necessary to complete an evaluation. ? ?OT OCCUPATIONAL PROFILE AND HISTORY: Problem focused assessment: Including review of records relating to presenting problem. ? ?CLINICAL DECISION MAKING: LOW - limited treatment options, no task modification necessary ? ?REHAB POTENTIAL: Good ? ?EVALUATION COMPLEXITY: Low ? ? ? ? ? ?PLAN: ?OT FREQUENCY: 2x/week ? ?OT DURATION: 4 weeks ? ?PLANNED INTERVENTIONS: self care/ADL training, therapeutic exercise, therapeutic activity, manual therapy, passive range of motion, electrical stimulation, ultrasound, patient/family education, and DME and/or AE instructions ? ? ?CONSULTED AND AGREED WITH PLAN OF CARE: Patient ? ?PLAN FOR NEXT SESSION: Pt will benefit from skilled OT services to decrease pain and fascial restrictions, increase joint ROM,  strength, and functional use of RUE as dominant during ADLs. Treatment plan: myofascial release, manual techniques, P/ROM, AA/ROM, A/ROM, general RUE strengthening, scapular mobility/stability/strengthening, modalities prn ? ? ?Guadelupe Sabin, OTR/L  ?[phone] ?03/07/2022, 11:07 AM ? ? ?

## 2022-03-13 ENCOUNTER — Encounter (HOSPITAL_COMMUNITY): Payer: Self-pay

## 2022-03-13 ENCOUNTER — Ambulatory Visit (HOSPITAL_COMMUNITY): Payer: Medicare HMO

## 2022-03-13 ENCOUNTER — Other Ambulatory Visit: Payer: Self-pay

## 2022-03-13 DIAGNOSIS — R29898 Other symptoms and signs involving the musculoskeletal system: Secondary | ICD-10-CM | POA: Diagnosis not present

## 2022-03-13 DIAGNOSIS — G8929 Other chronic pain: Secondary | ICD-10-CM | POA: Diagnosis not present

## 2022-03-13 DIAGNOSIS — M25611 Stiffness of right shoulder, not elsewhere classified: Secondary | ICD-10-CM

## 2022-03-13 DIAGNOSIS — M25511 Pain in right shoulder: Secondary | ICD-10-CM | POA: Diagnosis not present

## 2022-03-13 NOTE — Therapy (Signed)
?OUTPATIENT OCCUPATIONAL THERAPY TREATMENT NOTE ? ? ?Patient Name: Kristopher Gilmore ?MRN: [id number] ?DOB:May 14, 1956, 66 y.o., male ?Today's Date: 03/13/2022 ? ?PCP: Benita Stabile, MD ?REFERRING PROVIDER: Beverely Low, MD ? ? OT End of Session - 03/13/22 9030   ? ? Visit Number 2   ? Number of Visits 8   ? Date for OT Re-Evaluation 04/06/22   ? Authorization Type [employer] Medicare, co-pay $35   ? Progress Note Due on Visit 10   ? OT Start Time 0900   ? OT Stop Time [phone]   ? OT Time Calculation (min) 38 min   ? Activity Tolerance Patient tolerated treatment well   ? Behavior During Therapy [facility] for tasks assessed/performed   ? ?  ?  ? ?  ? ? ?Past Medical History:  ?Diagnosis Date  ? Asthma   ? childhood  ? Chronic kidney disease   ? Coarse tremors   ? Diabetes mellitus without complication ([facility])   ? GERD (gastroesophageal reflux disease)   ? History of kidney stones   ? Hypertension   ? ?Past Surgical History:  ?Procedure Laterality Date  ? ANKLE SURGERY Right   ? CARDIAC CATHETERIZATION    ? HERNIA REPAIR    ? WISDOM TOOTH EXTRACTION    ? ?There are no problems to display for this patient. ? ? ?ONSET DATE: Chronic - Years ? ?REFERRING DIAG: Right shoulder pain ? ?THERAPY DIAG:  ?Stiffness of right shoulder, not elsewhere classified ? ?Other symptoms and signs involving the musculoskeletal system ? ?Chronic right shoulder pain ? ? ?PERTINENT HISTORY: Pt is a 66 y/o male with right shoulder pain, reports he tore his shoulder years ago. He reports tears in both shoulders. Reports he was told he needs therapy first before insurance will cover a repair.  ? ?PRECAUTIONS: None ? ?SUBJECTIVE: S: The left is giving more trouble now. ? ?PAIN:  ?Are you having pain? Yes: NPRS scale: 6/10 ?Pain location: Bilateral shoulders ?Pain description: Sore and aching ?Aggravating factors: Increased use and movement ?Relieving factors: Nothing ? ? ? ? ?OBJECTIVE:  (Taken at evaluation on 03/07/22) ?UE ROM    ?  ?Active ROM Right ?03/07/2022   ?Shoulder flexion 114  ?Shoulder abduction 103  ?Shoulder internal rotation 90  ?Shoulder external rotation 35  ?  ?  ?                         ?Passive ROM Right ?03/07/2022  ?Shoulder flexion 151  ?Shoulder abduction 154  ?Shoulder internal rotation 90  ?Shoulder external rotation 35  ?  ?  ?  ?UE MMT:    ?  ?MMT Right ?03/07/2022  ?Shoulder flexion 3-/5  ?Shoulder abduction 3-/5  ?Shoulder internal rotation 3/5  ?Shoulder external rotation 3-/5  ?(Blank rows = not tested) ?  ? ?PATIENT EDUCATION: ?Education details:  ?Person educated:  ?International aid/development worker:  ?Education comprehension:   ?  ?HOME EXERCISE PROGRAM: ? ?Eval: table slides, scapular A/ROM ? ?GOALS: ?Goals reviewed with patient? Yes ?  ?SHORT TERM GOALS: Target date: 04/04/2022 ?  ?Pt will be provided with and educated on HEP to improve mobility of RUE required for use as dominant during ADL completion.  ?  ?Goal status: on-going ?  ?2.  Pt will increase RUE A/ROM to [facility] to improve ability to complete dressing and bathing tasks with minimal compensatory strategies.  ?  ?Goal status: on-going ?  ?3.  Pt will increase RUE strength to  4-/5 or greater to improve ability to lift lightweight items during functional tasks such as meal preparation. ?Goal status: on-going ?  ?4.  Pt will decrease pain in RUE to 4/10 or less to improve ability to sleep for 2+ consecutive hours without waking due to pain.  ?  ?Goal status: Ion-going ?  ?5.  Pt will decrease RUE fascial restrictions from mod to min amounts to improve mobility required for functional reaching tasks.  ?  ?Goal status: on-going ?  ?  ?TODAY'S TREATMENT: ?03/13/22 ?- Manual therapy completed prior to exercises. Myofascial release completed to right upper arm, upper trapezius, and scapularis region. ?- P/ROM, supine, all shoulder ranges, 5X ?- A/ROM, seated, scapular row, extension, 10X ?- Therapy ball: flexion, abduction, 10X, 2" hold at end stretch ?- Pulleys: flexion 1', abduction 1'  ? ? ? ? ? ? 03/13/22  0928  ?OT Assessment and Plan  ?Clinical Impression Statement A:Initiated myofascial release to right UE. Able to achieve functional passive ROM. Provided VC to relax UE during first part of stretch then to help bring the arm back (during flexion) to have eliminate pain experienced in the anterior shoulder region. VC for form and technique during session.  ?Pt will benefit from skilled therapeutic intervention in order to improve on the following performance deficits Body Structure / Function / Physical Skills  ?Body Structure / Function / Physical Skills ADL;UE functional use;Fascial restriction;Pain;ROM;Strength;IADL  ?Plan P: Continue with myofascial release to right shoulder. Attempt supine AA/ROM for flexion, IR/er, and protraction. May not be able to tolerate with Left shoulder pain. Add wall wash.  ?Consulted and Agree with Plan of Care Patient  ? ? ? ?Kristopher Gilmore, OTR/L,CBIS  ?[phone] ? ?03/13/2022, 9:46 AM ? ?  ? ?  ?

## 2022-03-14 ENCOUNTER — Ambulatory Visit (HOSPITAL_COMMUNITY): Payer: Medicare HMO | Admitting: Occupational Therapy

## 2022-03-14 ENCOUNTER — Encounter (HOSPITAL_COMMUNITY): Payer: Self-pay | Admitting: Occupational Therapy

## 2022-03-14 DIAGNOSIS — M25611 Stiffness of right shoulder, not elsewhere classified: Secondary | ICD-10-CM | POA: Diagnosis not present

## 2022-03-14 DIAGNOSIS — G8929 Other chronic pain: Secondary | ICD-10-CM

## 2022-03-14 DIAGNOSIS — R29898 Other symptoms and signs involving the musculoskeletal system: Secondary | ICD-10-CM | POA: Diagnosis not present

## 2022-03-14 DIAGNOSIS — M25511 Pain in right shoulder: Secondary | ICD-10-CM | POA: Diagnosis not present

## 2022-03-14 NOTE — Therapy (Signed)
?OUTPATIENT OCCUPATIONAL THERAPY TREATMENT NOTE ? ? ?Patient Name: Kristopher Gilmore ?MRN: [id number] ?DOB:Nov 12, 1956, 66 y.o., male ?Today's Date: 03/14/2022 ? ?PCP: Benita Stabile, MD ?REFERRING PROVIDER: Beverely Low, MD ? ? OT End of Session - 03/14/22 1054   ? ? Visit Number 3   ? Number of Visits 8   ? Date for OT Re-Evaluation 04/06/22   ? Authorization Type [employer] Medicare, co-pay $35   ? Progress Note Due on Visit 10   ? OT Start Time 1103   ? OT Stop Time 1143   ? OT Time Calculation (min) 40 min   ? Activity Tolerance Patient tolerated treatment well   ? Behavior During Therapy [facility] for tasks assessed/performed   ? ?  ?  ? ?  ? ? ?Past Medical History:  ?Diagnosis Date  ? Asthma   ? childhood  ? Chronic kidney disease   ? Coarse tremors   ? Diabetes mellitus without complication (HCC)   ? GERD (gastroesophageal reflux disease)   ? History of kidney stones   ? Hypertension   ? ?Past Surgical History:  ?Procedure Laterality Date  ? ANKLE SURGERY Right   ? CARDIAC CATHETERIZATION    ? HERNIA REPAIR    ? WISDOM TOOTH EXTRACTION    ? ?There are no problems to display for this patient. ? ? ?ONSET DATE: Chronic - Years ? ?REFERRING DIAG: Right shoulder pain ? ?THERAPY DIAG:  ?Stiffness of right shoulder, not elsewhere classified ? ?Other symptoms and signs involving the musculoskeletal system ? ?Chronic right shoulder pain ? ? ?PERTINENT HISTORY: Pt is a 66 y/o male with right shoulder pain, reports he tore his shoulder years ago. He reports tears in both shoulders. Reports he was told he needs therapy first before insurance will cover a repair.  ? ?PRECAUTIONS: None ? ?SUBJECTIVE: S: Been doing those exercises at home. Pt also reported that L shoulder pain started in January and that he has not scene the doctor about that shoulder since.  ? ?PAIN:   ?Are you having pain? Yes: NPRS scale: 7/10 L and 5/10 R shoulder ?Pain location: Bilateral shoulders ?Pain description: Sore and aching ?Aggravating factors: Increased use  and movement ?Relieving factors: Heat and massage ? ? ? ? ?OBJECTIVE:  (Taken at evaluation on 03/07/22) ?UE ROM    ?  ?Active ROM Right ?03/07/2022  ?Shoulder flexion 114  ?Shoulder abduction 103  ?Shoulder internal rotation 90  ?Shoulder external rotation 35  ?  ?  ?                         ?Passive ROM Right ?03/07/2022  ?Shoulder flexion 151  ?Shoulder abduction 154  ?Shoulder internal rotation 90  ?Shoulder external rotation 35  ?  ?  ?  ?UE MMT:    ?  ?MMT Right ?03/07/2022  ?Shoulder flexion 3-/5  ?Shoulder abduction 3-/5  ?Shoulder internal rotation 3/5  ?Shoulder external rotation 3-/5  ?(Blank rows = not tested) ?  ? ?PATIENT EDUCATION: ?Education details: Pt educated to see doctor about L shoulder pain.  ?Person educated: pt ?Education method: verbal  ?Education comprehension: verbalized understanding    ?  ?HOME EXERCISE PROGRAM: ? ?Eval: table slides, scapular A/ROM ? ?GOALS: ?Goals reviewed with patient? Yes ?  ?SHORT TERM GOALS: Target date: 04/04/2022 ?  ?Pt will be provided with and educated on HEP to improve mobility of RUE required for use as dominant during ADL completion.  ?  ?  Goal status: on-going ?  ?2.  Pt will increase RUE A/ROM to [facility] to improve ability to complete dressing and bathing tasks with minimal compensatory strategies.  ?  ?Goal status: on-going ?  ?3.  Pt will increase RUE strength to 4-/5 or greater to improve ability to lift lightweight items during functional tasks such as meal preparation. ?Goal status: on-going ?  ?4.  Pt will decrease pain in RUE to 4/10 or less to improve ability to sleep for 2+ consecutive hours without waking due to pain.  ?  ?Goal status: Ion-going ?  ?5.  Pt will decrease RUE fascial restrictions from mod to min amounts to improve mobility required for functional reaching tasks.  ?  ?Goal status: on-going ?  ?  ?TODAY'S TREATMENT: ?03/14/22 ?- Manual therapy completed prior to exercises. Myofascial release completed to right upper arm, upper trapezius, and  scapularis region. Trigger point release completed to trapezius region.  ?- P/ROM, supine, all shoulder ranges, 5X; Joint distraction for flexion with good benefit to relieve pain.  ?-AA/ROM supine, IR/er; x2 reps of flexion with severe pain leading to stoppage of AA/ROM.  ?- Wall wash for 1' standing ?- Therapy ball: flexion, abduction, 10X, 3" hold at end stretch ?- Pulleys: seated, flexion 1', abduction 1'  ? ? ? ? ? ? 03/14/22 1148  ?OT Assessment and Plan  ?Clinical Impression Statement A:Continued myofascial release to right UE. Able to achieve functional passive ROM. Pt limited by severe pain in L UE during attempt at AA/ROM flexion supine. Pt able to complete IR/er but AA/ROM discontinued after difficulty with flexion. Pt able to complete wall wash for one minute but with increase in pain when horizontally adducting.   ?Pt will benefit from skilled therapeutic intervention in order to improve on the following performance deficits Body Structure / Function / Physical Skills  ?Body Structure / Function / Physical Skills ADL;UE functional use;Fascial restriction;Pain;ROM;Strength;IADL  ?Plan P: Continue with myofascial release to right shoulder. Attempt supine AA/ROM addition of protraction and possibly progressing to abduction or flexion but with caution due to pt severe pain in L UE this date.   ?Consulted and Agree with Plan of Care Patient  ? ? ? Danie Chandler OT, MOT ? ?[phone] ? ?03/14/2022, 11:55 AM ? ?  ? ?  ?

## 2022-03-20 ENCOUNTER — Telehealth (HOSPITAL_COMMUNITY): Payer: Self-pay | Admitting: Occupational Therapy

## 2022-03-21 ENCOUNTER — Encounter (HOSPITAL_COMMUNITY): Payer: Medicare HMO | Admitting: Occupational Therapy

## 2022-03-26 ENCOUNTER — Encounter (HOSPITAL_COMMUNITY): Payer: Medicare HMO | Admitting: Occupational Therapy

## 2022-03-29 DIAGNOSIS — Z86010 Personal history of colonic polyps: Secondary | ICD-10-CM | POA: Diagnosis not present

## 2022-03-29 DIAGNOSIS — K449 Diaphragmatic hernia without obstruction or gangrene: Secondary | ICD-10-CM | POA: Diagnosis not present

## 2022-03-29 DIAGNOSIS — E041 Nontoxic single thyroid nodule: Secondary | ICD-10-CM | POA: Diagnosis not present

## 2022-03-29 DIAGNOSIS — I2510 Atherosclerotic heart disease of native coronary artery without angina pectoris: Secondary | ICD-10-CM | POA: Diagnosis not present

## 2022-03-29 DIAGNOSIS — R079 Chest pain, unspecified: Secondary | ICD-10-CM | POA: Diagnosis not present

## 2022-03-29 DIAGNOSIS — Z8679 Personal history of other diseases of the circulatory system: Secondary | ICD-10-CM | POA: Diagnosis not present

## 2022-03-29 DIAGNOSIS — E1169 Type 2 diabetes mellitus with other specified complication: Secondary | ICD-10-CM | POA: Diagnosis not present

## 2022-04-03 DIAGNOSIS — E1165 Type 2 diabetes mellitus with hyperglycemia: Secondary | ICD-10-CM | POA: Diagnosis not present

## 2022-04-03 DIAGNOSIS — E1169 Type 2 diabetes mellitus with other specified complication: Secondary | ICD-10-CM | POA: Diagnosis not present

## 2022-04-03 DIAGNOSIS — I10 Essential (primary) hypertension: Secondary | ICD-10-CM | POA: Diagnosis not present

## 2022-04-03 DIAGNOSIS — E782 Mixed hyperlipidemia: Secondary | ICD-10-CM | POA: Diagnosis not present

## 2022-04-03 DIAGNOSIS — I2510 Atherosclerotic heart disease of native coronary artery without angina pectoris: Secondary | ICD-10-CM | POA: Diagnosis not present

## 2022-04-03 DIAGNOSIS — G250 Essential tremor: Secondary | ICD-10-CM | POA: Diagnosis not present

## 2022-04-03 DIAGNOSIS — K4090 Unilateral inguinal hernia, without obstruction or gangrene, not specified as recurrent: Secondary | ICD-10-CM | POA: Diagnosis not present

## 2022-04-03 DIAGNOSIS — M75101 Unspecified rotator cuff tear or rupture of right shoulder, not specified as traumatic: Secondary | ICD-10-CM | POA: Diagnosis not present

## 2022-04-03 DIAGNOSIS — N401 Enlarged prostate with lower urinary tract symptoms: Secondary | ICD-10-CM | POA: Diagnosis not present

## 2022-04-03 DIAGNOSIS — Z86010 Personal history of colonic polyps: Secondary | ICD-10-CM | POA: Diagnosis not present

## 2022-04-04 ENCOUNTER — Other Ambulatory Visit: Payer: Self-pay | Admitting: Surgery

## 2022-04-04 ENCOUNTER — Encounter (HOSPITAL_COMMUNITY): Payer: Medicare HMO | Admitting: Occupational Therapy

## 2022-04-04 DIAGNOSIS — E041 Nontoxic single thyroid nodule: Secondary | ICD-10-CM

## 2022-04-06 ENCOUNTER — Telehealth: Payer: Self-pay

## 2022-04-06 NOTE — Telephone Encounter (Signed)
NOTES SCANNED TO REFERRAL 

## 2022-04-09 ENCOUNTER — Encounter (HOSPITAL_COMMUNITY): Payer: Medicare HMO

## 2022-04-11 ENCOUNTER — Encounter (HOSPITAL_COMMUNITY): Payer: Medicare HMO

## 2022-04-11 ENCOUNTER — Encounter (HOSPITAL_COMMUNITY): Payer: Self-pay

## 2022-04-11 NOTE — Therapy (Signed)
[place] [facility] ?[address] ?[place], [place], [address] ?Phone: [phone]   Fax:  [phone] ? ?April 11, 2022  ? Esmond Plants, MD ? ? ?Occupational Therapy Discharge Summary  ? ?Patient: Kristopher Gilmore ?MRN: [id number] ?Date of Birth: 05-Apr-1956 ? ?Diagnosis: Right shoulder pain ? ? ? ?The above patient had been seen in Occupational Therapy 3 times of 8 treatments scheduled with  1 no shows and 4 cancellations. ? ?The treatment consisted of manual therapy, therapeutic activities, therapeutic exercises, Patient education ?The patient is: Unchanged ? ?Subjective: Patient called to cancel last appointment scheduled and requested discharge. He states that he needs to see his Psychologist, sport and exercise.  ? ?Discharge Findings: MMT and ROM of the right arm is the same as initial evaluation as patient did not present for final visit to reassess shoulder.  ? ?Functional Status at Discharge: decreased ROM, decreased strength, increased pain and increased fascial restrictions. Limited use of his right shoulder ? ?No Goals Met ? ? ? ?Sincerely, ? ?Ailene Ravel, OTR/L,CBIS  ?[phone] ? ? ?[place] [facility] ?[address] ?[place], [place], [address] ?Phone: [phone]   Fax:  [phone] ? ?Patient: [name] ?MRN: [id number] ?Date of Birth: [birth year] ?

## 2022-04-18 ENCOUNTER — Ambulatory Visit
Admission: RE | Admit: 2022-04-18 | Discharge: 2022-04-18 | Disposition: A | Discharge status: Home / Self Care | Payer: Medicare HMO | Source: Ambulatory Visit | Attending: Surgery | Admitting: Surgery

## 2022-04-18 DIAGNOSIS — E041 Nontoxic single thyroid nodule: Secondary | ICD-10-CM | POA: Diagnosis not present

## 2022-04-24 ENCOUNTER — Encounter: Payer: Self-pay | Admitting: Interventional Cardiology

## 2022-04-24 ENCOUNTER — Ambulatory Visit: Payer: Medicare HMO | Admitting: Interventional Cardiology

## 2022-04-24 VITALS — BP 152/80 | HR 82 | Ht 69.0 in | Wt 225.0 lb

## 2022-04-24 DIAGNOSIS — I2584 Coronary atherosclerosis due to calcified coronary lesion: Secondary | ICD-10-CM | POA: Diagnosis not present

## 2022-04-24 DIAGNOSIS — R072 Precordial pain: Secondary | ICD-10-CM

## 2022-04-24 DIAGNOSIS — I10 Essential (primary) hypertension: Secondary | ICD-10-CM | POA: Diagnosis not present

## 2022-04-24 DIAGNOSIS — Z01810 Encounter for preprocedural cardiovascular examination: Secondary | ICD-10-CM | POA: Diagnosis not present

## 2022-04-24 DIAGNOSIS — E782 Mixed hyperlipidemia: Secondary | ICD-10-CM | POA: Diagnosis not present

## 2022-04-24 DIAGNOSIS — E1159 Type 2 diabetes mellitus with other circulatory complications: Secondary | ICD-10-CM

## 2022-04-24 DIAGNOSIS — I2510 Atherosclerotic heart disease of native coronary artery without angina pectoris: Secondary | ICD-10-CM

## 2022-04-24 MED ORDER — METOPROLOL TARTRATE 100 MG PO TABS: ORAL_TABLET | ORAL | 0 refills | Status: DC

## 2022-04-24 NOTE — Patient Instructions (Addendum)
Medication Instructions:  ?Your physician recommends that you continue on your current medications as directed. Please refer to the Current Medication list given to you today. ? ?*If you need a refill on your cardiac medications before your next appointment, please call your pharmacy* ? ? ?Lab Work: ?Lab work to be done today--BMP ?If you have labs (blood work) drawn today and your tests are completely normal, you will receive your results only by: ?MyChart Message (if you have MyChart) OR ?A paper copy in the mail ?If you have any lab test that is abnormal or we need to change your treatment, we will call you to review the results. ? ? ?Testing/Procedures: ?Your physician has requested that you have cardiac CT. Cardiac computed tomography (CT) is a painless test that uses an x-ray machine to take clear, detailed pictures of your heart. For further information please visit [email or web address] Please follow instruction sheet as given. ? ? ? ? ?Follow-Up: ?At [facility], you and your health needs are our priority.  As part of our continuing mission to provide you with exceptional heart care, we have created designated Provider Care Teams.  These Care Teams include your primary Cardiologist (physician) and Advanced Practice Providers (APPs -  Physician Assistants and Nurse Practitioners) who all work together to provide you with the care you need, when you need it. ? ?We recommend signing up for the patient portal called "MyChart".  Sign up information is provided on this After Visit Summary.  MyChart is used to connect with patients for Virtual Visits (Telemedicine).  Patients are able to view lab/test results, encounter notes, upcoming appointments, etc.  Non-urgent messages can be sent to your provider as well.   ?To learn more about what you can do with MyChart, go to [email or web address].   ? ?Your next appointment:   ?Based on test results ? ?The format for your next appointment:   ?In Person ? ?Provider:    ?Lance Muss, MD   ? ? ?Other Instructions ? ? ?Your cardiac CT will be scheduled at one of the below locations:  ? ?[facility] ?[address] ?[place], [place] [address] ?[phone] ? ?OR ? ?[facility] ?[address] ?[address] ?[place], [place] [address] ?[phone] ? ?If scheduled at [facility], please arrive at the [facility] Entrance (Entrance C2) of [facility] 30 minutes prior to test start time. ?You can use the FREE valet parking offered at entrance C (encouraged to control the heart rate for the test)  ?Proceed to the [facility] Radiology Department (first floor) to check-in and test prep. ? ?All radiology patients and guests should use entrance C2 at [facility], accessed from [facility], even though the hospital's physical address listed is [address] ? ? ? ?If scheduled at [facility], please arrive 15 mins early for check-in and test prep. ? ?Please follow these instructions carefully (unless otherwise directed): ? ?Hold all erectile dysfunction medications at least 3 days (72 hrs) prior to test. ? ?On the Night Before the Test: ?Be sure to Drink plenty of water. ?Do not consume any caffeinated/decaffeinated beverages or chocolate 12 hours prior to your test. ?Do not take any antihistamines 12 hours prior to your test. ? ? ?On the Day of the Test: ?Drink plenty of water until 1 hour prior to the test. ?Do not eat any food 4 hours prior to the test. ?You may take your regular medications  prior to the test.  ?Take metoprolol (Lopressor) two hours prior to test.  Do not take propranolol the day of the test ?HOLD Furosemide/Hydrochlorothiazide morning of the test. ? ? ?     ?After the Test: ?Drink plenty of water. ?After receiving IV contrast, you may experience a mild flushed feeling. This is normal. ?On occasion, you may experience a mild rash up to 24  hours after the test. This is not dangerous. If this occurs, you can take Benadryl 25 mg and increase your fluid intake. ?If you experience trouble breathing, this can be serious. If it is severe call 911 IMMEDIATELY. If it is mild, please call our office. ?If you take any of these medications: Glipizide/Metformin, Avandament, Glucavance, please do not take 48 hours after completing test unless otherwise instructed. ? ?We will call to schedule your test 2-4 weeks out understanding that some insurance companies will need an authorization prior to the service being performed.  ? ?For non-scheduling related questions, please contact the cardiac imaging nurse navigator should you have any questions/concerns: ?Rockwell Alexandria, Cardiac Imaging Nurse Navigator ?Larey Brick, Cardiac Imaging Nurse Navigator ?[facility] ?Direct Office Dial: [phone]  ? ?For scheduling needs, including cancellations and rescheduling, please call [place], [phone]. ? ? ?Important Information About Sugar ? ? ? ? ? ? ?

## 2022-04-24 NOTE — Progress Notes (Signed)
?  ?Cardiology Office Note ? ? ?Date:  04/24/2022  ? ?ID:  Kristopher Gilmore, DOB Sep 24, 1956, MRN [id number] ? ?PCP:  Benita Stabile, MD  ? ? ?No chief complaint on file. ? ? ? ?Wt Readings from Last 3 Encounters:  ?04/24/22 225 lb (102.1 kg)  ?02/28/22 225 lb (102.1 kg)  ?01/02/22 225 lb 14.4 oz (102.5 kg)  ?  ? ?  ?History of Present Illness: ?Kristopher Gilmore is a 66 y.o. male who is being seen today for the evaluation of chest pain at the request of Stechschulte, Hyman Hopes, MD.  ? ?Prior records from [facility] show:"Patient presents with chest pressure.  He is accompanied by family who assists with the history. ?Pressure has been present on and off for the past 4 days, no clear alleviating, exacerbating, precipitating factors.  He saw his physician today and was sent here for evaluation.  He acknowledges multiple other medical issues, but states he has no history of cardiac disease.  He has been taking his medication as directed." Troponins negative. ? ?He has a colonoscopy planned.  He was told it was a hiatal hernia. ? ?Risk factors for heart disease include family history with father having heart attack in his 101s, diabetes with A1c 9.6, hypertension, hyperlipidemia. ? ? ?Denies :  Dizziness. Leg edema. Nitroglycerin use. Orthopnea. Palpitations. Paroxysmal nocturnal dyspnea. Syncope.   ? ?Has had some exertional fatigue of late.  ? ?Past Medical History:  ?Diagnosis Date  ? Arthritis   ? Asthma   ? childhood  ? Chest pain   ? Chronic kidney disease   ? Coarse tremors   ? Diabetes mellitus without complication (HCC)   ? DM (diabetes mellitus) (HCC)   ? GERD (gastroesophageal reflux disease)   ? Hiatal hernia   ? History of kidney stones   ? Hyperkalemia   ? Hypertension   ? Rotator cuff tear arthropathy   ? Shoulder pain   ? ? ?Past Surgical History:  ?Procedure Laterality Date  ? ANKLE SURGERY Right   ? CARDIAC CATHETERIZATION    ? HERNIA REPAIR    ? WISDOM TOOTH EXTRACTION    ? ? ? ?Current Outpatient Medications   ?Medication Sig Dispense Refill  ? amLODipine (NORVASC) 5 MG tablet Take 5 mg by mouth at bedtime.    ? aspirin EC 81 MG tablet Take 81 mg by mouth daily. Swallow whole.    ? atorvastatin (LIPITOR) 10 MG tablet Take 10 mg by mouth daily.    ? glipiZIDE (GLUCOTROL XL) 10 MG 24 hr tablet Take 10 mg by mouth 2 (two) times daily.    ? lisinopril (ZESTRIL) 40 MG tablet Take 40 mg by mouth daily.    ? omeprazole (PRILOSEC) 20 MG capsule Take 20 mg by mouth daily.    ? predniSONE (DELTASONE) 20 MG tablet Take 2 tablets (40 mg total) by mouth daily with breakfast. For the next four days 8 tablet 0  ? propranolol (INDERAL) 10 MG tablet Take 10 mg by mouth daily.    ? sucralfate (CARAFATE) 1 g tablet Take 1 tablet (1 g total) by mouth 4 (four) times daily -  with meals and at bedtime. 21 tablet 0  ? tamsulosin (FLOMAX) 0.4 MG CAPS capsule Take 0.4 mg by mouth daily.    ? ?No current facility-administered medications for this visit.  ? ? ?Allergies:   Metformin  ? ? ?Social History:  The patient  reports that he has quit smoking. His  smoking use included cigarettes. He has never used smokeless tobacco. He reports current alcohol use of about 2.0 standard drinks per week. He reports that he does not use drugs.  ? ?Family History:  The patient's family history includes Diabetes Mellitus II in his sister; Heart attack in his father.  ? ? ?ROS:  Please see the history of present illness.   Otherwise, review of systems are positive for chest pain, fatigue.   All other systems are reviewed and negative.  ? ? ?PHYSICAL EXAM: ?VS:  BP (!) 152/80   Pulse 82   Ht 5\' 9"  (1.753 m)   Wt 225 lb (102.1 kg)   SpO2 95%   BMI 33.23 kg/m?  , BMI Body mass index is 33.23 kg/m?. ?GEN: Well nourished, well developed, in no acute distress ?HEENT: normal ?Neck: no JVD, carotid bruits, or masses ?Cardiac: RRR; no murmurs, rubs, or gallops,no edema  ?Respiratory:  clear to auscultation bilaterally, normal work of breathing ?GI: soft, nontender,  nondistended, + BS ?MS: no deformity or atrophy ?Skin: warm and dry, no rash ?Neuro:  Strength and sensation are intact ?Psych: euthymic mood, full affect ? ? ?EKG:   ?The ekg ordered today demonstrates NSR, no ST changes ? ? ?Recent Labs: ?02/28/2022: BUN 9; Creatinine, Ser 1.32; Hemoglobin 12.8; Platelets 189; Potassium 4.2; Sodium 137  ? ?Lipid Panel ?No results found for: CHOL, TRIG, HDL, CHOLHDL, VLDL, LDLCALC, LDLDIRECT ?  ?Other studies Reviewed: ?Additional studies/ records that were reviewed today with results demonstrating: labs reviewed. ? ? ?ASSESSMENT AND PLAN: ? ?Coronary Calcium noted on PE protocol CT. Given chest pain and RF for CAD, to evaluate for CAD, will plan for a CT angiogram of the heart.  We will give a dose of metoprolol 100 mg prior to the test to help get a better quality study.  Hold propranolol that day.  ?HTN: Has not been taking meds as prescribed until last week.  May need to increase amlodipine to 10 mg daily if the blood pressure stays high.  Low salt diet.  Regular exercise as noted below recommended.  ?Hyperlipidemia: total cholesterol 97 HDL 46 LDL 37 triglycerides 64.  COntinue atorvastatin 10 mg daily. Healthy diet. ?DM: A1C elevated.  Whole food, plant-based diet.  High-fiber diet.  Avoid processed food.   ?Preoperative cardiovascular exam: Colonoscopy and possible shoulder surgery planned.  ? ? ?Current medicines are reviewed at length with the patient today.  The patient concerns regarding his medicines were addressed. ? ?The following changes have been made:  No change ? ?Labs/ tests ordered today include: CTA ?No orders of the defined types were placed in this encounter. ? ? ?Recommend 150 minutes/week of aerobic exercise ?Low fat, low carb, high fiber diet recommended ? ?Disposition:   FU for CTA ? ? ?Signed, ?03/02/2022, MD  ?04/24/2022 4:16 PM    ?[facility] ?[address] [place], [place], [place]  [place] ?Phone: [phone]; Fax: [phone]  ? ?

## 2022-04-24 NOTE — Telephone Encounter (Signed)
Received a voice mail message from Pt stating he needed to cancel his appt, appt canceled per Pt's request  ?

## 2022-04-25 LAB — BASIC METABOLIC PANEL
BUN/Creatinine Ratio: 5 — ABNORMAL LOW (ref 10–24)
BUN: 7 mg/dL — ABNORMAL LOW (ref 8–27)
CO2: 22 mmol/L (ref 20–29)
Calcium: 9.7 mg/dL (ref 8.6–10.2)
Chloride: 102 mmol/L (ref 96–106)
Creatinine, Ser: 1.41 mg/dL — ABNORMAL HIGH (ref 0.76–1.27)
Glucose: 299 mg/dL — ABNORMAL HIGH (ref 70–99)
Potassium: 4.3 mmol/L (ref 3.5–5.2)
Sodium: 140 mmol/L (ref 134–144)
eGFR: 55 mL/min/{1.73_m2} — ABNORMAL LOW (ref >59)

## 2022-05-02 ENCOUNTER — Emergency Department (HOSPITAL_COMMUNITY): Payer: Medicare HMO

## 2022-05-02 ENCOUNTER — Encounter (HOSPITAL_COMMUNITY): Payer: Self-pay

## 2022-05-02 ENCOUNTER — Inpatient Hospital Stay (HOSPITAL_COMMUNITY)
Admission: EM | Admit: 2022-05-02 | Discharge: 2022-05-04 | DRG: 728 | Disposition: A | Discharge status: Home / Self Care | Payer: Medicare HMO | Attending: Internal Medicine | Admitting: Internal Medicine

## 2022-05-02 ENCOUNTER — Other Ambulatory Visit: Payer: Self-pay

## 2022-05-02 DIAGNOSIS — Z6833 Body mass index (BMI) 33.0-33.9, adult: Secondary | ICD-10-CM | POA: Diagnosis not present

## 2022-05-02 DIAGNOSIS — Z79899 Other long term (current) drug therapy: Secondary | ICD-10-CM

## 2022-05-02 DIAGNOSIS — I129 Hypertensive chronic kidney disease with stage 1 through stage 4 chronic kidney disease, or unspecified chronic kidney disease: Secondary | ICD-10-CM | POA: Diagnosis not present

## 2022-05-02 DIAGNOSIS — L03818 Cellulitis of other sites: Secondary | ICD-10-CM

## 2022-05-02 DIAGNOSIS — Z888 Allergy status to other drugs, medicaments and biological substances status: Secondary | ICD-10-CM | POA: Diagnosis not present

## 2022-05-02 DIAGNOSIS — N1831 Chronic kidney disease, stage 3a: Secondary | ICD-10-CM | POA: Diagnosis not present

## 2022-05-02 DIAGNOSIS — E1165 Type 2 diabetes mellitus with hyperglycemia: Secondary | ICD-10-CM

## 2022-05-02 DIAGNOSIS — L0390 Cellulitis, unspecified: Secondary | ICD-10-CM | POA: Diagnosis present

## 2022-05-02 DIAGNOSIS — N433 Hydrocele, unspecified: Secondary | ICD-10-CM | POA: Diagnosis not present

## 2022-05-02 DIAGNOSIS — I10 Essential (primary) hypertension: Secondary | ICD-10-CM

## 2022-05-02 DIAGNOSIS — N50812 Left testicular pain: Secondary | ICD-10-CM | POA: Diagnosis not present

## 2022-05-02 DIAGNOSIS — I861 Scrotal varices: Secondary | ICD-10-CM | POA: Diagnosis not present

## 2022-05-02 DIAGNOSIS — Z833 Family history of diabetes mellitus: Secondary | ICD-10-CM

## 2022-05-02 DIAGNOSIS — N503 Cyst of epididymis: Secondary | ICD-10-CM | POA: Diagnosis not present

## 2022-05-02 DIAGNOSIS — K4090 Unilateral inguinal hernia, without obstruction or gangrene, not specified as recurrent: Secondary | ICD-10-CM | POA: Diagnosis not present

## 2022-05-02 DIAGNOSIS — N492 Inflammatory disorders of scrotum: Secondary | ICD-10-CM | POA: Diagnosis not present

## 2022-05-02 DIAGNOSIS — E669 Obesity, unspecified: Secondary | ICD-10-CM | POA: Diagnosis present

## 2022-05-02 DIAGNOSIS — K219 Gastro-esophageal reflux disease without esophagitis: Secondary | ICD-10-CM | POA: Diagnosis not present

## 2022-05-02 DIAGNOSIS — E1169 Type 2 diabetes mellitus with other specified complication: Secondary | ICD-10-CM

## 2022-05-02 DIAGNOSIS — Z87891 Personal history of nicotine dependence: Secondary | ICD-10-CM | POA: Diagnosis not present

## 2022-05-02 DIAGNOSIS — R262 Difficulty in walking, not elsewhere classified: Secondary | ICD-10-CM | POA: Diagnosis not present

## 2022-05-02 DIAGNOSIS — Z7952 Long term (current) use of systemic steroids: Secondary | ICD-10-CM | POA: Diagnosis not present

## 2022-05-02 DIAGNOSIS — E1122 Type 2 diabetes mellitus with diabetic chronic kidney disease: Secondary | ICD-10-CM | POA: Diagnosis not present

## 2022-05-02 DIAGNOSIS — E119 Type 2 diabetes mellitus without complications: Secondary | ICD-10-CM

## 2022-05-02 DIAGNOSIS — Z8249 Family history of ischemic heart disease and other diseases of the circulatory system: Secondary | ICD-10-CM

## 2022-05-02 DIAGNOSIS — N50819 Testicular pain, unspecified: Secondary | ICD-10-CM | POA: Diagnosis not present

## 2022-05-02 LAB — URINALYSIS, ROUTINE W REFLEX MICROSCOPIC
Bacteria, UA: NONE SEEN
Bilirubin Urine: NEGATIVE
Glucose, UA: 50 mg/dL — AB
Hgb urine dipstick: NEGATIVE
Ketones, ur: NEGATIVE mg/dL
Leukocytes,Ua: NEGATIVE
Nitrite: NEGATIVE
Protein, ur: 30 mg/dL — AB
Specific Gravity, Urine: 1.015 (ref 1.005–1.030)
pH: 6 (ref 5.0–8.0)

## 2022-05-02 LAB — CBC WITH DIFFERENTIAL/PLATELET
Abs Immature Granulocytes: 0.05 10*3/uL (ref 0.00–0.07)
Basophils Absolute: 0 10*3/uL (ref 0.0–0.1)
Basophils Relative: 0 %
Eosinophils Absolute: 0.2 10*3/uL (ref 0.0–0.5)
Eosinophils Relative: 1 %
HCT: 38 % — ABNORMAL LOW (ref 39.0–52.0)
Hemoglobin: 12.6 g/dL — ABNORMAL LOW (ref 13.0–17.0)
Immature Granulocytes: 0 %
Lymphocytes Relative: 13 %
Lymphs Abs: 1.8 10*3/uL (ref 0.7–4.0)
MCH: 30.7 pg (ref 26.0–34.0)
MCHC: 33.2 g/dL (ref 30.0–36.0)
MCV: 92.7 fL (ref 80.0–100.0)
Monocytes Absolute: 1 10*3/uL (ref 0.1–1.0)
Monocytes Relative: 7 %
Neutro Abs: 10.6 10*3/uL — ABNORMAL HIGH (ref 1.7–7.7)
Neutrophils Relative %: 79 %
Platelets: 166 10*3/uL (ref 150–400)
RBC: 4.1 MIL/uL — ABNORMAL LOW (ref 4.22–5.81)
RDW: 12.8 % (ref 11.5–15.5)
WBC: 13.7 10*3/uL — ABNORMAL HIGH (ref 4.0–10.5)
nRBC: 0 % (ref 0.0–0.2)

## 2022-05-02 LAB — COMPREHENSIVE METABOLIC PANEL
ALT: 32 U/L (ref 0–44)
AST: 32 U/L (ref 15–41)
Albumin: 4.2 g/dL (ref 3.5–5.0)
Alkaline Phosphatase: 83 U/L (ref 38–126)
Anion gap: 10 (ref 5–15)
BUN: 9 mg/dL (ref 8–23)
CO2: 24 mmol/L (ref 22–32)
Calcium: 9.3 mg/dL (ref 8.9–10.3)
Chloride: 103 mmol/L (ref 98–111)
Creatinine, Ser: 1.34 mg/dL — ABNORMAL HIGH (ref 0.61–1.24)
GFR, Estimated: 58 mL/min — ABNORMAL LOW (ref >60)
Glucose, Bld: 166 mg/dL — ABNORMAL HIGH (ref 70–99)
Potassium: 4 mmol/L (ref 3.5–5.1)
Sodium: 137 mmol/L (ref 135–145)
Total Bilirubin: 0.8 mg/dL (ref 0.3–1.2)
Total Protein: 7.7 g/dL (ref 6.5–8.1)

## 2022-05-02 MED ORDER — ONDANSETRON HCL 4 MG PO TABS: 4.0000 mg | ORAL_TABLET | Freq: Four times a day (QID) | ORAL | Status: DC | PRN

## 2022-05-02 MED ORDER — INSULIN ASPART 100 UNIT/ML IJ SOLN
0.0000 [IU] | Freq: Three times a day (TID) | INTRAMUSCULAR | Status: DC
  Administered 2022-05-03: 2 [IU] via SUBCUTANEOUS
  Administered 2022-05-03: 5 [IU] via SUBCUTANEOUS
  Administered 2022-05-03: 1 [IU] via SUBCUTANEOUS
  Administered 2022-05-04: 2 [IU] via SUBCUTANEOUS

## 2022-05-02 MED ORDER — POLYETHYLENE GLYCOL 3350 17 G PO PACK: 17.0000 g | PACK | Freq: Every day | ORAL | Status: DC | PRN

## 2022-05-02 MED ORDER — ENOXAPARIN SODIUM 40 MG/0.4ML IJ SOSY
40.0000 mg | PREFILLED_SYRINGE | INTRAMUSCULAR | Status: DC
  Administered 2022-05-03 – 2022-05-04 (×2): 40 mg via SUBCUTANEOUS
  Filled 2022-05-02 (×2): qty 0.4

## 2022-05-02 MED ORDER — SODIUM CHLORIDE 0.9 % IV SOLN
2.0000 g | Freq: Once | INTRAVENOUS | Status: AC
  Administered 2022-05-02: 2 g via INTRAVENOUS
  Filled 2022-05-02: qty 20

## 2022-05-02 MED ORDER — FENTANYL CITRATE PF 50 MCG/ML IJ SOSY
50.0000 ug | PREFILLED_SYRINGE | Freq: Once | INTRAMUSCULAR | Status: AC
  Administered 2022-05-02: 50 ug via INTRAVENOUS
  Filled 2022-05-02: qty 1

## 2022-05-02 MED ORDER — HYDROMORPHONE HCL 1 MG/ML IJ SOLN
1.0000 mg | Freq: Once | INTRAMUSCULAR | Status: AC
  Administered 2022-05-02: 1 mg via INTRAVENOUS
  Filled 2022-05-02: qty 1

## 2022-05-02 MED ORDER — INSULIN ASPART 100 UNIT/ML IJ SOLN
0.0000 [IU] | Freq: Every day | INTRAMUSCULAR | Status: DC
  Administered 2022-05-03 (×2): 2 [IU] via SUBCUTANEOUS

## 2022-05-02 MED ORDER — HYDROMORPHONE HCL 1 MG/ML IJ SOLN
1.0000 mg | INTRAMUSCULAR | Status: DC | PRN
  Administered 2022-05-03 (×2): 1 mg via INTRAVENOUS
  Filled 2022-05-02 (×2): qty 1

## 2022-05-02 MED ORDER — SODIUM CHLORIDE 0.9 % IV SOLN
2.0000 g | INTRAVENOUS | Status: DC
  Administered 2022-05-03: 2 g via INTRAVENOUS
  Filled 2022-05-02: qty 20

## 2022-05-02 MED ORDER — ONDANSETRON HCL 4 MG/2ML IJ SOLN
4.0000 mg | Freq: Four times a day (QID) | INTRAMUSCULAR | Status: DC | PRN
  Administered 2022-05-03: 4 mg via INTRAVENOUS
  Filled 2022-05-02: qty 2

## 2022-05-02 MED ORDER — IOHEXOL 300 MG/ML  SOLN
100.0000 mL | Freq: Once | INTRAMUSCULAR | Status: AC | PRN
  Administered 2022-05-02: 100 mL via INTRAVENOUS

## 2022-05-02 MED ORDER — ONDANSETRON HCL 4 MG/2ML IJ SOLN
4.0000 mg | Freq: Once | INTRAMUSCULAR | Status: AC
  Administered 2022-05-02: 4 mg via INTRAVENOUS
  Filled 2022-05-02: qty 2

## 2022-05-02 MED ORDER — ACETAMINOPHEN 325 MG PO TABS
650.0000 mg | ORAL_TABLET | Freq: Four times a day (QID) | ORAL | Status: DC | PRN
Start: 2022-05-02 — End: 2022-05-04
  Administered 2022-05-03 – 2022-05-04 (×2): 650 mg via ORAL
  Filled 2022-05-02 (×2): qty 2

## 2022-05-02 MED ORDER — ACETAMINOPHEN 650 MG RE SUPP: 650.0000 mg | Freq: Four times a day (QID) | RECTAL | Status: DC | PRN

## 2022-05-02 NOTE — ED Triage Notes (Addendum)
Pt sent by PCP c/o L testicle pain starting this morning.  Pt woke up w/ pain.  Denies swelling.  Pain score 10/10.  Pt reports pain is constant and has intermittent sharp pains.    ?

## 2022-05-02 NOTE — ED Notes (Signed)
Pt states nauseated and pain 8/10. PA notified.  ?

## 2022-05-02 NOTE — Assessment & Plan Note (Addendum)
Left scrotal cellulitis, ultrasound negative for torsion, ultrasound and CT suggest cellulitis, no abscess or collection.  Tmax 99.4.  Leukocytosis of 13.7.  Rules out for sepsis.  UA not suggestive of UTI.  Persistent pain despite 2 doses of Dilaudid and fentanyl hence hospitalization.  Patient reports unable to walk -IV ceftriaxone 2 g daily initially started>>d/c home with cefdinir x 5 more days -urine cultures neg -IV Dilaudid 1 mg every 4 hourly as needed>>stated pain improve and did not want opioids at time of d/c

## 2022-05-02 NOTE — ED Notes (Signed)
Patient transported to CT 

## 2022-05-02 NOTE — ED Notes (Signed)
Pt given ice chips

## 2022-05-02 NOTE — ED Provider Notes (Signed)
[facility] DEPARTMENT Provider Note   CSN: [id number] Arrival date & time: 05/02/22  [id number]     History  Chief Complaint  Patient presents with   Testicle Pain    Kristopher Gilmore is a 66 y.o. male.  Patient with history of hypertension and diabetes presents today with complaints of testicular pain.  He states that same began this morning and awoke him from sleep and has been progressively worsening throughout the day today.  Pain is located to the left testicle and is severe in nature. He states that he is unable to stand or move due to significant pain.  He denies any other symptoms including fevers, chills, abdominal pain, nausea, vomiting, diarrhea dysuria, or hematuria.  He denies any history of similar symptoms in the past.  The history is provided by the patient. No language interpreter was used.  Testicle Pain Pertinent negatives include no abdominal pain.      Home Medications Prior to Admission medications   Medication Sig Start Date End Date Taking? Authorizing Provider  amLODipine (NORVASC) 5 MG tablet Take 5 mg by mouth at bedtime. 04/03/22   [provider]  aspirin EC 81 MG tablet Take 81 mg by mouth daily. Swallow whole.    [provider]  atorvastatin (LIPITOR) 10 MG tablet Take 10 mg by mouth daily. 09/28/21   [provider]  glipiZIDE (GLUCOTROL XL) 10 MG 24 hr tablet Take 10 mg by mouth 2 (two) times daily. 04/03/22   [provider]  lisinopril (ZESTRIL) 40 MG tablet Take 40 mg by mouth daily. 12/25/21   [provider]  metoprolol tartrate (LOPRESSOR) 100 MG tablet Take one tablet by mouth 2 hours prior to CT scan 04/24/22   Corky CraftsVaranasi, Jayadeep S, MD  omeprazole (PRILOSEC) 20 MG capsule Take 20 mg by mouth daily. 07/01/21   [provider]  predniSONE (DELTASONE) 20 MG tablet Take 2 tablets (40 mg total) by mouth daily with breakfast. For the next four days 02/28/22   Gerhard MunchLockwood, Robert, MD  propranolol  (INDERAL) 10 MG tablet Take 10 mg by mouth daily. 11/23/21   [provider]  sucralfate (CARAFATE) 1 g tablet Take 1 tablet (1 g total) by mouth 4 (four) times daily -  with meals and at bedtime. 02/28/22   Gerhard MunchLockwood, Robert, MD  tamsulosin (FLOMAX) 0.4 MG CAPS capsule Take 0.4 mg by mouth daily.    [provider]      Allergies    Metformin    Review of Systems   Review of Systems  Constitutional:  Negative for chills and fever.  Gastrointestinal:  Negative for abdominal pain, diarrhea, nausea and vomiting.  Genitourinary:  Positive for testicular pain. Negative for dysuria.  All other systems reviewed and are negative.  Physical Exam Updated Vital Signs BP (!) 163/99 (BP Location: Right Arm)   Pulse 82   Temp 99.4 F (37.4 C) (Oral)   Resp 18   Ht 5\' 9"  (1.753 m)   Wt 102.1 kg   SpO2 96%   BMI 33.23 kg/m  Physical Exam Vitals and nursing note reviewed. Exam conducted with a chaperone present.  Constitutional:      General: He is not in acute distress.    Appearance: Normal appearance. He is normal weight. He is not ill-appearing, toxic-appearing or diaphoretic.  HENT:     Head: Normocephalic and atraumatic.  Cardiovascular:     Rate and Rhythm: Normal rate.  Pulmonary:     Effort:  Pulmonary effort is normal. No respiratory distress.  Abdominal:     General: Abdomen is flat.     Palpations: Abdomen is soft.     Tenderness: There is no abdominal tenderness.  Genitourinary:    Penis: Normal.      Comments: Significant tenderness noted to palpation of the scrotum with some overlying skin changes to the scrotum and groin. No purulence, drainage, swelling, or warmth noted. Musculoskeletal:        General: Normal range of motion.     Cervical back: Normal range of motion.  Skin:    General: Skin is warm and dry.  Neurological:     General: No focal deficit present.     Mental Status: He is alert.  Psychiatric:        Mood and Affect: Mood normal.         Behavior: Behavior normal.    ED Results / Procedures / Treatments   Labs (all labs ordered are listed, but only abnormal results are displayed) Labs Reviewed  URINALYSIS, ROUTINE W REFLEX MICROSCOPIC - Abnormal; Notable for the following components:      Result Value   Glucose, UA 50 (*)    Protein, ur 30 (*)    All other components within normal limits  CBC WITH DIFFERENTIAL/PLATELET - Abnormal; Notable for the following components:   WBC 13.7 (*)    RBC 4.10 (*)    Hemoglobin 12.6 (*)    HCT 38.0 (*)    Neutro Abs 10.6 (*)    All other components within normal limits  COMPREHENSIVE METABOLIC PANEL - Abnormal; Notable for the following components:   Glucose, Bld 166 (*)    Creatinine, Ser 1.34 (*)    GFR, Estimated 58 (*)    All other components within normal limits  URINE CULTURE    EKG None  Radiology CT PELVIS W CONTRAST  Result Date: 05/02/2022 CLINICAL DATA:  Testicular pain and swelling.  Concern for abscess. EXAM: CT PELVIS WITH CONTRAST TECHNIQUE: Multidetector CT imaging of the pelvis was performed using the standard protocol following the bolus administration of intravenous contrast. RADIATION DOSE REDUCTION: This exam was performed according to the departmental dose-optimization program which includes automated exposure control, adjustment of the mA and/or kV according to patient size and/or use of iterative reconstruction technique. CONTRAST:  OMNIPAQUE IOHEXOL 300 MG/ML  SOLN COMPARISON:  Scrotal ultrasound dated 05/02/2022. FINDINGS: Urinary Tract:  No abnormality visualized. Bowel:  Unremarkable visualized pelvic bowel loops. Vascular/Lymphatic: No pathologically enlarged lymph nodes. No significant vascular abnormality seen. Reproductive: The prostate and seminal vesicles are grossly remarkable. Minimal thickened appearance of the scrotal wall with small bilateral hydroceles. No drainable fluid collection or abscess. There is slight prominence of the left  spermatic cord. Correlation with urinalysis recommended to exclude an infectious process. Other: Small fat containing left inguinal hernia. Postsurgical changes in the region of the right groin. Musculoskeletal: No suspicious bone lesions identified. IMPRESSION: Minimal thickened appearance of the scrotal wall with small bilateral hydroceles. Findings may represent cellulitis. No drainable fluid collection or abscess. Electronically Signed   By: Elgie Collard M.D.   On: 05/02/2022 19:59   US SCROTUM W/DOPPLER  Result Date: 05/02/2022 CLINICAL DATA:  testicle pain, LEFT EXAM: SCROTAL ULTRASOUND DOPPLER ULTRASOUND OF THE TESTICLES TECHNIQUE: Complete ultrasound examination of the testicles, epididymis, and other scrotal structures was performed. Color and spectral Doppler ultrasound were also utilized to evaluate blood flow to the testicles. COMPARISON:  None Available. FINDINGS: Right testicle Measurements:  4.5 x 2.1 x 2.6 cm. No suspicious mass or microlithiasis visualized. There is incidental note of a cyst which measures 7 x 5 x 5 mm. Left testicle Measurements: 4.1 x 2.6 x 2.8 cm. No suspicious mass or microlithiasis visualized. There is incidental note of a cyst which measures 8 x 9 x 7 mm. Right epididymis: Normal in size and appearance. Benign epididymal cyst versus spermatocele is noted which measures 6 x 4 x 6 mm. Left epididymis: Normal in size and appearance. Benign epididymal cysts versus spermatoceles noted which measures 7 x 5 x 4 mm. Hydrocele:  No significant hydrocele is visualized. Varicocele:  None visualized. Pulsed Doppler interrogation of both testes demonstrates normal low resistance arterial and venous waveforms bilaterally. Skin appears mildly thickened on the LEFT in comparison to the RIGHT. IMPRESSION: 1. No evidence of testicular torsion. 2. LEFT skin appears mildly thickened in comparison to the contralateral side. This could reflect cellulitis in the appropriate clinical setting.  Recommend correlation with physical exam. Electronically Signed   By: Meda Klinefelter M.D.   On: 05/02/2022 17:08    Procedures Procedures    Medications Ordered in ED Medications  fentaNYL (SUBLIMAZE) injection 50 mcg (has no administration in time range)    ED Course/ Medical Decision Making/ A&P                           Medical Decision Making Amount and/or Complexity of Data Reviewed Labs: ordered. Radiology: ordered.  Risk Prescription drug management. Decision regarding hospitalization.   This patient presents to the ED for concern of testicular pain, this involves an extensive number of treatment options, and is a complaint that carries with it a high risk of complications and morbidity.   Co morbidities that complicate the patient evaluation  Hx diabetes   Lab Tests:  I Ordered, and personally interpreted labs.  The pertinent results include:  WBC 13.7, Creatinine 1.34 unchanged from baseline. UA with mild glucosuria and proteinuria. Culture pending   Imaging Studies ordered:  I ordered imaging studies including US scrotum, CT pelvis  I independently visualized and interpreted imaging which showed  [place]:  1. No evidence of testicular torsion. 2. LEFT skin appears mildly thickened in comparison to the contralateral side. This could reflect cellulitis in the appropriate clinical setting.  CT: Minimal thickened appearance of the scrotal wall with small bilateral hydroceles. Findings may represent cellulitis. No drainable fluid collection or abscess. I agree with the radiologist interpretation   Problem List / ED Course / Critical interventions / Medication management  I ordered medication including fentanyl and dilaudid  for pain, rocephin for cellulitis  Reevaluation of the patient after these medicines showed that the patient improved  Patient presents today with acute onset left testicular pain.  Found to have scrotal cellulitis on imaging.  No concern  for Fournier's gangrene at this time.  Given antibiotics for same.  However following IV fentanyl and Dilaudid, patient still states that his pain is significant.  Therefore will seek admission for management for pain control and IV antibiotics.  Patient is understanding and amenable with plan.  Discussed patient with hospitalist who agrees to admit  Findings and plan of care discussed with supervising physician Dr. Hyacinth Meeker who is in agreement.    Final Clinical Impression(s) / ED Diagnoses Final diagnoses:  Cellulitis of scrotum    Rx / DC Orders ED Discharge Orders     None  Vear Clock 05/02/22 2153    Eber Hong, MD 05/03/22 1102

## 2022-05-02 NOTE — Assessment & Plan Note (Addendum)
Stable.   Restart inderal, lisinopril, amlodipine

## 2022-05-02 NOTE — Assessment & Plan Note (Addendum)
-   HgbA1c 7.7 -Hold glipizide>>restart after d/c - SSI- S and semglee during hospitalization - follow up with PCP for adjustment of regimen after d/c

## 2022-05-02 NOTE — H&P (Signed)
?History and Physical  ? ? ?Kristopher Gilmore [id number] DOB: 18-Oct-1956 DOA: 05/02/2022 ? ?PCP: Celene Squibb, MD  ? ?Patient coming from: Home ? ?I have personally briefly reviewed patient's old medical records in [place] ? ?Chief Complaint: Testicular pain. ? ?HPI: Kristopher Gilmore is a 66 y.o. male with medical history significant for diabetes mellitus, hypertension,, asthma. ?Patient presented to the ED with complaints of pain in his left testicle that started this morning when he woke up.  He reports because of the pain is unable to walk, and the pain from touching it could make him cry.  No fevers no chills no vomiting.  He denies any trauma to the area and is not sure how the pain started. ? ?ED Course: Temperature 99.4.  WBC 13.7.  ReSound negative for testicular torsion, but shows mildly thickened left skin compared to right.  Subsequent CT pelvis with contrast-findings to suggest represent cellulitis.  No drainable fluid collection or abscess. ?Examination of the groin was done in the ED, significant tenderness was noted on palpation of the scrotum with overlying skin changes to the scrotum and groin, no purulence drainage swelling or warmth noted. ?IV ceftriaxone started. ?Patient was given 2 doses of Dilaudid, fentanyl with persistent pain, hence hospitalization was requested ? ?Review of Systems: As per HPI all other systems reviewed and negative. ? ?Past Medical History:  ?Diagnosis Date  ? Arthritis   ? Asthma   ? childhood  ? Chest pain   ? Chronic kidney disease   ? Coarse tremors   ? Diabetes mellitus without complication ([place])   ? DM (diabetes mellitus) ([place])   ? GERD (gastroesophageal reflux disease)   ? Hiatal hernia   ? History of kidney stones   ? Hyperkalemia   ? Hypertension   ? Rotator cuff tear arthropathy   ? Shoulder pain   ? ? ?Past Surgical History:  ?Procedure Laterality Date  ? ANKLE SURGERY Right   ? CARDIAC CATHETERIZATION    ? HERNIA REPAIR    ? WISDOM TOOTH EXTRACTION     ? ? ? reports that he has quit smoking. His smoking use included cigarettes. He has never used smokeless tobacco. He reports current alcohol use of about 2.0 standard drinks per week. He reports that he does not use drugs. ? ?Allergies  ?Allergen Reactions  ? Metformin   ?  Cramping  ? ? ?Family History  ?Problem Relation Age of Onset  ? Heart attack Father   ? Diabetes Mellitus II Sister   ? ? ?Prior to Admission medications   ?Medication Sig Start Date End Date Taking? Authorizing Provider  ?amLODipine (NORVASC) 5 MG tablet Take 5 mg by mouth at bedtime. 04/03/22   [provider]  ?aspirin EC 81 MG tablet Take 81 mg by mouth daily. Swallow whole.    [provider]  ?atorvastatin (LIPITOR) 10 MG tablet Take 10 mg by mouth daily. 09/28/21   [provider]  ?glipiZIDE (GLUCOTROL XL) 10 MG 24 hr tablet Take 10 mg by mouth 2 (two) times daily. 04/03/22   [provider]  ?lisinopril (ZESTRIL) 40 MG tablet Take 40 mg by mouth daily. 12/25/21   [provider]  ?metoprolol tartrate (LOPRESSOR) 100 MG tablet Take one tablet by mouth 2 hours prior to CT scan 04/24/22   Jettie Booze, MD  ?omeprazole (PRILOSEC) 20 MG capsule Take 20 mg by mouth daily. 07/01/21   [provider]  ?predniSONE (DELTASONE) 20  MG tablet Take 2 tablets (40 mg total) by mouth daily with breakfast. For the next four days 02/28/22   Carmin Muskrat, MD  ?propranolol (INDERAL) 10 MG tablet Take 10 mg by mouth daily. 11/23/21   [provider]  ?sucralfate (CARAFATE) 1 g tablet Take 1 tablet (1 g total) by mouth 4 (four) times daily -  with meals and at bedtime. 02/28/22   Carmin Muskrat, MD  ?tamsulosin (FLOMAX) 0.4 MG CAPS capsule Take 0.4 mg by mouth daily.    [provider]  ? ? ?Physical Exam: ?Vitals:  ? 05/02/22 1800 05/02/22 1830 05/02/22 2000 05/02/22 2100  ?BP: 130/81 111/89 132/76 132/67  ?Pulse: 69 69 69 70  ?Resp: 19 13 20 19   ?Temp:      ?TempSrc:      ?SpO2:  98% 100% 98% 97%  ?Weight:      ?Height:      ? ? ?Constitutional: NAD, calm, comfortable ?Vitals:  ? 05/02/22 1800 05/02/22 1830 05/02/22 2000 05/02/22 2100  ?BP: 130/81 111/89 132/76 132/67  ?Pulse: 69 69 69 70  ?Resp: 19 13 20 19   ?Temp:      ?TempSrc:      ?SpO2: 98% 100% 98% 97%  ?Weight:      ?Height:      ? ?Eyes: PERRL, lids and conjunctivae normal ?ENMT: Mucous membranes are moist. .  ?Neck: normal, supple, no masses, no thyromegaly ?Respiratory: clear to auscultation bilaterally, no wheezing, no crackles. Normal respiratory effort. No accessory muscle use.  ?Cardiovascular: Regular rate and rhythm, no murmurs / rubs / gallops. No extremity edema.  Lower extremities warm.   ?Abdomen: no tenderness, no masses palpated. No hepatosplenomegaly. Bowel sounds positive.  ?Musculoskeletal: no clubbing / cyanosis. No joint deformity upper and lower extremities.  ?Skin: no rashes, lesions, ulcers. No induration ?Neurologic: No apparent cranial nerve abnormality, moving extremities spontaneously. ?Psychiatric: Normal judgment and insight. Alert and oriented x 3. Normal mood.  ? ?Labs on Admission: I have personally reviewed following labs and imaging studies ? ?CBC: ?Recent Labs  ?Lab 05/02/22 ?1621  ?WBC 13.7*  ?NEUTROABS 10.6*  ?HGB 12.6*  ?HCT 38.0*  ?MCV 92.7  ?PLT 166  ? ?Basic Metabolic Panel: ?Recent Labs  ?Lab 05/02/22 ?1621  ?NA 137  ?K 4.0  ?CL 103  ?CO2 24  ?GLUCOSE 166*  ?BUN 9  ?CREATININE 1.34*  ?CALCIUM 9.3  ? ?GFR: ?Estimated Creatinine Clearance: 63 mL/min (A) (by C-G formula based on SCr of 1.34 mg/dL (H)). ?Liver Function Tests: ?Recent Labs  ?Lab 05/02/22 ?1621  ?AST 32  ?ALT 32  ?ALKPHOS 83  ?BILITOT 0.8  ?PROT 7.7  ?ALBUMIN 4.2  ? ?Urine analysis: ?   ?Component Value Date/Time  ? COLORURINE YELLOW 05/02/2022 1631  ? APPEARANCEUR CLEAR 05/02/2022 1631  ? LABSPEC 1.015 05/02/2022 1631  ? PHURINE 6.0 05/02/2022 1631  ? GLUCOSEU 50 (A) 05/02/2022 1631  ? HGBUR NEGATIVE 05/02/2022 1631  ? [place]  NEGATIVE 05/02/2022 1631  ? [place] NEGATIVE 05/02/2022 1631  ? PROTEINUR 30 (A) 05/02/2022 1631  ? NITRITE NEGATIVE 05/02/2022 1631  ? LEUKOCYTESUR NEGATIVE 05/02/2022 1631  ? ? ?Radiological Exams on Admission: ?CT PELVIS W CONTRAST ? ?Result Date: 05/02/2022 ?CLINICAL DATA:  Testicular pain and swelling.  Concern for abscess. EXAM: CT PELVIS WITH CONTRAST TECHNIQUE: Multidetector CT imaging of the pelvis was performed using the standard protocol following the bolus administration of intravenous contrast. RADIATION DOSE REDUCTION: This exam was performed according to the departmental dose-optimization program which  includes automated exposure control, adjustment of the mA and/or kV according to patient size and/or use of iterative reconstruction technique. CONTRAST:  155mL OMNIPAQUE IOHEXOL 300 MG/ML  SOLN COMPARISON:  Scrotal ultrasound dated 05/02/2022. FINDINGS: Urinary Tract:  No abnormality visualized. Bowel:  Unremarkable visualized pelvic bowel loops. Vascular/Lymphatic: No pathologically enlarged lymph nodes. No significant vascular abnormality seen. Reproductive: The prostate and seminal vesicles are grossly remarkable. Minimal thickened appearance of the scrotal wall with small bilateral hydroceles. No drainable fluid collection or abscess. There is slight prominence of the left spermatic cord. Correlation with urinalysis recommended to exclude an infectious process. Other: Small fat containing left inguinal hernia. Postsurgical changes in the region of the right groin. Musculoskeletal: No suspicious bone lesions identified. IMPRESSION: Minimal thickened appearance of the scrotal wall with small bilateral hydroceles. Findings may represent cellulitis. No drainable fluid collection or abscess. Electronically Signed   By: Anner Crete M.D.   On: 05/02/2022 19:59  ? ?US SCROTUM W/DOPPLER ? ?Result Date: 05/02/2022 ?CLINICAL DATA:  testicle pain, LEFT EXAM: SCROTAL ULTRASOUND DOPPLER ULTRASOUND OF THE  TESTICLES TECHNIQUE: Complete ultrasound examination of the testicles, epididymis, and other scrotal structures was performed. Color and spectral Doppler ultrasound were also utilized to evaluate blood flo

## 2022-05-03 DIAGNOSIS — N492 Inflammatory disorders of scrotum: Secondary | ICD-10-CM | POA: Diagnosis not present

## 2022-05-03 LAB — GLUCOSE, CAPILLARY
Glucose-Capillary: 215 mg/dL — ABNORMAL HIGH (ref 70–99)
Glucose-Capillary: 254 mg/dL — ABNORMAL HIGH (ref 70–99)
Glucose-Capillary: 144 mg/dL — ABNORMAL HIGH (ref 70–99)
Glucose-Capillary: 190 mg/dL — ABNORMAL HIGH (ref 70–99)
Glucose-Capillary: 204 mg/dL — ABNORMAL HIGH (ref 70–99)

## 2022-05-03 LAB — BASIC METABOLIC PANEL
Anion gap: 9 (ref 5–15)
BUN: 13 mg/dL (ref 8–23)
CO2: 23 mmol/L (ref 22–32)
Calcium: 8.8 mg/dL — ABNORMAL LOW (ref 8.9–10.3)
Chloride: 102 mmol/L (ref 98–111)
Creatinine, Ser: 1.36 mg/dL — ABNORMAL HIGH (ref 0.61–1.24)
GFR, Estimated: 57 mL/min — ABNORMAL LOW (ref >60)
Glucose, Bld: 220 mg/dL — ABNORMAL HIGH (ref 70–99)
Potassium: 4.2 mmol/L (ref 3.5–5.1)
Sodium: 134 mmol/L — ABNORMAL LOW (ref 135–145)

## 2022-05-03 LAB — CBC
HCT: 35.7 % — ABNORMAL LOW (ref 39.0–52.0)
Hemoglobin: 11.9 g/dL — ABNORMAL LOW (ref 13.0–17.0)
MCH: 30.9 pg (ref 26.0–34.0)
MCHC: 33.3 g/dL (ref 30.0–36.0)
MCV: 92.7 fL (ref 80.0–100.0)
Platelets: 162 10*3/uL (ref 150–400)
RBC: 3.85 MIL/uL — ABNORMAL LOW (ref 4.22–5.81)
RDW: 13 % (ref 11.5–15.5)
WBC: 14.6 10*3/uL — ABNORMAL HIGH (ref 4.0–10.5)
nRBC: 0 % (ref 0.0–0.2)

## 2022-05-03 LAB — URINE CULTURE: Culture: NO GROWTH

## 2022-05-03 LAB — HIV ANTIBODY (ROUTINE TESTING W REFLEX): HIV Screen 4th Generation wRfx: NONREACTIVE

## 2022-05-03 MED ORDER — SODIUM CHLORIDE 0.9 % IV SOLN: INTRAVENOUS | Status: DC

## 2022-05-03 MED ORDER — INSULIN GLARGINE-YFGN 100 UNIT/ML ~~LOC~~ SOLN
10.0000 [IU] | Freq: Every day | SUBCUTANEOUS | Status: DC
  Administered 2022-05-03 – 2022-05-04 (×2): 10 [IU] via SUBCUTANEOUS
  Filled 2022-05-03 (×3): qty 0.1

## 2022-05-03 MED ORDER — PROPRANOLOL HCL 20 MG PO TABS
10.0000 mg | ORAL_TABLET | Freq: Two times a day (BID) | ORAL | Status: DC
  Administered 2022-05-03 – 2022-05-04 (×3): 10 mg via ORAL
  Filled 2022-05-03 (×3): qty 1

## 2022-05-03 NOTE — TOC Progression Note (Signed)
  Transition of Care [facility]) Screening Note   Patient Details  Name: Kristopher Gilmore Date of Birth: January 14, 1956   [facility] CM/SW Contact:    Shade Flood, LCSW Phone Number: 05/03/2022, 9:32 AM    Transition of Care Department [facility]) has reviewed patient and no TOC needs have been identified at this time. We will continue to monitor patient advancement through interdisciplinary progression rounds. If new patient transition needs arise, please place a TOC consult.

## 2022-05-03 NOTE — Plan of Care (Signed)
Reviewed care plan with patient. Pain management addressed with patient. Patient verbalized understanding of current care plan and pain management.

## 2022-05-03 NOTE — Progress Notes (Signed)
Left PROGRESS NOTE    Kristopher Gilmore  IWP:[id number] DOB: July 30, 1956 DOA: 05/02/2022 PCP: Benita Stabile, MD  66/M with history of DM 2, hypertension and asthma presented to the ED with pain and discomfort involving his left testicle X 1 to 2 days -In the ED his temp was 99.4, WBC was 13.7, testicular ultrasound was negative for torsion, CT pelvis noted minimal thickened appearance of the scrotal wall with small bilateral hydroceles. Findings may represent cellulitis   Subjective: Feels a little better, pain and discomfort in his scrotum is starting to improve  Assessment and Plan:  Left scrotal cellulitis -CT and ultrasound negative for torsion, malignancy etc.  -Continue IV ceftriaxone  -Urine culture likely to be negative,  -Continue supportive care at this time, recommend urology follow-up after discharge  Essential hypertension -Blood pressure in the 110, 120 range, he is on amlodipine lisinopril and propranolol at baseline, will restart propranolol  Diabetes mellitus (HCC) CBGs elevated, follow-up HbA1c, add low-dose Semglee  CKD 3a -Stable, monitor  DVT prophylaxis: Lovenox Code Status: Full code Family Communication: Discussed patient detail, no family at bedside Disposition Plan: Home in 1 to 2 days  Consultants:    Procedures:   Antimicrobials:    Objective: Vitals:   05/02/22 2100 05/02/22 2300 05/02/22 2342 05/03/22 0840  BP: 132/67 130/82 128/66 116/73  Pulse: 70 71 62 66  Resp: 19 17 16 18   Temp:  98.9 F (37.2 C) 97.7 F (36.5 C) 98.3 F (36.8 C)  TempSrc:   Oral Oral  SpO2: 97% 98% 100% 100%  Weight:      Height:        Intake/Output Summary (Last 24 hours) at 05/03/2022 1204 Last data filed at 05/02/2022 2235 Gross per 24 hour  Intake 100 ml  Output --  Net 100 ml   Filed Weights   05/02/22 1611  Weight: 102.1 kg    Examination:  General exam: Appears calm and comfortable  Respiratory system: Clear to auscultation Cardiovascular  system: S1 & S2 heard, RRR.  Abd: nondistended, soft and nontender.Normal bowel sounds heard. Central nervous system: Alert and oriented. No focal neurological deficits. Extremities: no edema GU: Mild swelling and tenderness of L side of scrotum Skin: No rashes Psychiatry:  Mood & affect appropriate.     Data Reviewed:   CBC: Recent Labs  Lab 05/02/22 1621 05/03/22 0325  WBC 13.7* 14.6*  NEUTROABS 10.6*  --   HGB 12.6* 11.9*  HCT 38.0* 35.7*  MCV 92.7 92.7  PLT 166 162   Basic Metabolic Panel: Recent Labs  Lab 05/02/22 1621 05/03/22 0325  NA 137 134*  K 4.0 4.2  CL 103 102  CO2 24 23  GLUCOSE 166* 220*  BUN 9 13  CREATININE 1.34* 1.36*  CALCIUM 9.3 8.8*   GFR: Estimated Creatinine Clearance: 62.1 mL/min (A) (by C-G formula based on SCr of 1.36 mg/dL (H)). Liver Function Tests: Recent Labs  Lab 05/02/22 1621  AST 32  ALT 32  ALKPHOS 83  BILITOT 0.8  PROT 7.7  ALBUMIN 4.2   No results for input(s): LIPASE, AMYLASE in the last 168 hours. No results for input(s): AMMONIA in the last 168 hours. Coagulation Profile: No results for input(s): INR, PROTIME in the last 168 hours. Cardiac Enzymes: No results for input(s): CKTOTAL, CKMB, CKMBINDEX, TROPONINI in the last 168 hours. BNP (last 3 results) No results for input(s): PROBNP in the last 8760 hours. HbA1C: No results for input(s): HGBA1C in the last 72  hours. CBG: Recent Labs  Lab 05/02/22 2343 05/03/22 0734 05/03/22 1126  GLUCAP 215* 254* 144*   Lipid Profile: No results for input(s): CHOL, HDL, LDLCALC, TRIG, CHOLHDL, LDLDIRECT in the last 72 hours. Thyroid Function Tests: No results for input(s): TSH, T4TOTAL, FREET4, T3FREE, THYROIDAB in the last 72 hours. Anemia Panel: No results for input(s): VITAMINB12, FOLATE, FERRITIN, TIBC, IRON, RETICCTPCT in the last 72 hours. Urine analysis:    Component Value Date/Time   COLORURINE YELLOW 05/02/2022 1631   APPEARANCEUR CLEAR 05/02/2022 1631    LABSPEC 1.015 05/02/2022 1631   PHURINE 6.0 05/02/2022 1631   GLUCOSEU 50 (A) 05/02/2022 1631   HGBUR NEGATIVE 05/02/2022 1631   BILIRUBINUR NEGATIVE 05/02/2022 1631   KETONESUR NEGATIVE 05/02/2022 1631   PROTEINUR 30 (A) 05/02/2022 1631   NITRITE NEGATIVE 05/02/2022 1631   LEUKOCYTESUR NEGATIVE 05/02/2022 1631   Sepsis Labs: @LABRCNTIP (procalcitonin:4,lacticidven:4)  )No results found for this or any previous visit (from the past 240 hour(s)).   Radiology Studies: CT PELVIS W CONTRAST  Result Date: 05/02/2022 CLINICAL DATA:  Testicular pain and swelling.  Concern for abscess. EXAM: CT PELVIS WITH CONTRAST TECHNIQUE: Multidetector CT imaging of the pelvis was performed using the standard protocol following the bolus administration of intravenous contrast. RADIATION DOSE REDUCTION: This exam was performed according to the departmental dose-optimization program which includes automated exposure control, adjustment of the mA and/or kV according to patient size and/or use of iterative reconstruction technique. CONTRAST:  05/04/2022 OMNIPAQUE IOHEXOL 300 MG/ML  SOLN COMPARISON:  Scrotal ultrasound dated 05/02/2022. FINDINGS: Urinary Tract:  No abnormality visualized. Bowel:  Unremarkable visualized pelvic bowel loops. Vascular/Lymphatic: No pathologically enlarged lymph nodes. No significant vascular abnormality seen. Reproductive: The prostate and seminal vesicles are grossly remarkable. Minimal thickened appearance of the scrotal wall with small bilateral hydroceles. No drainable fluid collection or abscess. There is slight prominence of the left spermatic cord. Correlation with urinalysis recommended to exclude an infectious process. Other: Small fat containing left inguinal hernia. Postsurgical changes in the region of the right groin. Musculoskeletal: No suspicious bone lesions identified. IMPRESSION: Minimal thickened appearance of the scrotal wall with small bilateral hydroceles. Findings may  represent cellulitis. No drainable fluid collection or abscess. Electronically Signed   By: 05/04/2022 M.D.   On: 05/02/2022 19:59   05/04/2022 SCROTUM W/DOPPLER  Result Date: 05/02/2022 CLINICAL DATA:  testicle pain, LEFT EXAM: SCROTAL ULTRASOUND DOPPLER ULTRASOUND OF THE TESTICLES TECHNIQUE: Complete ultrasound examination of the testicles, epididymis, and other scrotal structures was performed. Color and spectral Doppler ultrasound were also utilized to evaluate blood flow to the testicles. COMPARISON:  None Available. FINDINGS: Right testicle Measurements: 4.5 x 2.1 x 2.6 cm. No suspicious mass or microlithiasis visualized. There is incidental note of a cyst which measures 7 x 5 x 5 mm. Left testicle Measurements: 4.1 x 2.6 x 2.8 cm. No suspicious mass or microlithiasis visualized. There is incidental note of a cyst which measures 8 x 9 x 7 mm. Right epididymis: Normal in size and appearance. Benign epididymal cyst versus spermatocele is noted which measures 6 x 4 x 6 mm. Left epididymis: Normal in size and appearance. Benign epididymal cysts versus spermatoceles noted which measures 7 x 5 x 4 mm. Hydrocele:  No significant hydrocele is visualized. Varicocele:  None visualized. Pulsed Doppler interrogation of both testes demonstrates normal low resistance arterial and venous waveforms bilaterally. Skin appears mildly thickened on the LEFT in comparison to the RIGHT. IMPRESSION: 1. No evidence of testicular torsion. 2. LEFT skin  appears mildly thickened in comparison to the contralateral side. This could reflect cellulitis in the appropriate clinical setting. Recommend correlation with physical exam. Electronically Signed   By: Meda KlinefelterStephanie  Peacock M.D.   On: 05/02/2022 17:08     Scheduled Meds:  enoxaparin (LOVENOX) injection  40 mg Subcutaneous Q24H   insulin aspart  0-5 Units Subcutaneous QHS   insulin aspart  0-9 Units Subcutaneous TID WC   insulin glargine-yfgn  10 Units Subcutaneous Daily    Continuous Infusions:  cefTRIAXone (ROCEPHIN)  IV       LOS: 1 day    Time spent: 35min  Zannie CovePreetha Bryannah Boston, MD [facility]   05/03/2022, 12:04 PM

## 2022-05-04 DIAGNOSIS — E1165 Type 2 diabetes mellitus with hyperglycemia: Secondary | ICD-10-CM

## 2022-05-04 DIAGNOSIS — N492 Inflammatory disorders of scrotum: Principal | ICD-10-CM

## 2022-05-04 DIAGNOSIS — I10 Essential (primary) hypertension: Secondary | ICD-10-CM | POA: Diagnosis not present

## 2022-05-04 DIAGNOSIS — N1831 Chronic kidney disease, stage 3a: Secondary | ICD-10-CM | POA: Diagnosis not present

## 2022-05-04 LAB — BASIC METABOLIC PANEL
Anion gap: 7 (ref 5–15)
BUN: 15 mg/dL (ref 8–23)
CO2: 21 mmol/L — ABNORMAL LOW (ref 22–32)
Calcium: 8.4 mg/dL — ABNORMAL LOW (ref 8.9–10.3)
Chloride: 108 mmol/L (ref 98–111)
Creatinine, Ser: 1.32 mg/dL — ABNORMAL HIGH (ref 0.61–1.24)
GFR, Estimated: 59 mL/min — ABNORMAL LOW (ref >60)
Glucose, Bld: 214 mg/dL — ABNORMAL HIGH (ref 70–99)
Potassium: 4.2 mmol/L (ref 3.5–5.1)
Sodium: 136 mmol/L (ref 135–145)

## 2022-05-04 LAB — CBC
HCT: 34.2 % — ABNORMAL LOW (ref 39.0–52.0)
Hemoglobin: 10.8 g/dL — ABNORMAL LOW (ref 13.0–17.0)
MCH: 29.7 pg (ref 26.0–34.0)
MCHC: 31.6 g/dL (ref 30.0–36.0)
MCV: 94 fL (ref 80.0–100.0)
Platelets: 130 10*3/uL — ABNORMAL LOW (ref 150–400)
RBC: 3.64 MIL/uL — ABNORMAL LOW (ref 4.22–5.81)
RDW: 12.9 % (ref 11.5–15.5)
WBC: 11 10*3/uL — ABNORMAL HIGH (ref 4.0–10.5)
nRBC: 0 % (ref 0.0–0.2)

## 2022-05-04 LAB — GLUCOSE, CAPILLARY: Glucose-Capillary: 161 mg/dL — ABNORMAL HIGH (ref 70–99)

## 2022-05-04 MED ORDER — CEFDINIR 300 MG PO CAPS: 300.0000 mg | ORAL_CAPSULE | Freq: Two times a day (BID) | ORAL | 0 refills | Status: AC

## 2022-05-04 MED ORDER — CEFDINIR 300 MG PO CAPS
300.0000 mg | ORAL_CAPSULE | Freq: Two times a day (BID) | ORAL | Status: DC
  Administered 2022-05-04: 300 mg via ORAL
  Filled 2022-05-04: qty 1

## 2022-05-04 NOTE — Discharge Summary (Addendum)
Physician Discharge Summary   Patient: Kristopher Gilmore MRN: [id number] DOB: 09/21/1956  Admit date:     05/02/2022  Discharge date: 05/04/22  Discharge Physician: Shanon Brow Marielouise Amey   PCP: Celene Squibb, MD   Recommendations at discharge:   Please follow up with primary care provider within 1-2 weeks  Please repeat BMP and CBC in one week    Hospital Course: 66/M with history of DM 2, hypertension and asthma presented to the ED with pain and discomfort involving his left testicle X 1 to 2 days -In the ED his temp was 99.4, WBC was 13.7, testicular ultrasound was negative for torsion, CT pelvis noted minimal thickened appearance of the scrotal wall with small bilateral hydroceles. Findings may represent cellulitis Patient was started on IV ceftriaxone and opioids with clinical improvement   Assessment and Plan: * Cellulitis Left scrotal cellulitis, ultrasound negative for torsion, ultrasound and CT suggest cellulitis, no abscess or collection.  Tmax 99.4.  Leukocytosis of 13.7.  Rules out for sepsis.  UA not suggestive of UTI.  Persistent pain despite 2 doses of Dilaudid and fentanyl hence hospitalization.  Patient reports unable to walk -IV ceftriaxone 2 g daily initially started>>d/c home with cefdinir x 5 more days -urine cultures neg -IV Dilaudid 1 mg every 4 hourly as needed>>stated pain improve and did not want opioids at time of d/c  Stage 3a chronic kidney disease (CKD) (HCC) Baseline creatinine 1.3-1.5  Essential hypertension Stable.   Restart inderal, lisinopril, amlodipine  Uncontrolled type 2 diabetes mellitus with hyperglycemia, without long-term current use of insulin (HCC) - HgbA1c 7.7 -Hold glipizide>>restart after d/c - SSI- S and semglee during hospitalization - follow up with PCP for adjustment of regimen after d/c          Consultants: none Procedures performed: none  Disposition: Home Diet recommendation:  Carb modified diet DISCHARGE  MEDICATION: Allergies as of 05/04/2022       Reactions   Metformin    Cramping        Medication List     TAKE these medications    amLODipine 5 MG tablet Commonly known as: NORVASC Take 5 mg by mouth at bedtime.   aspirin EC 81 MG tablet Take 81 mg by mouth daily. Swallow whole.   atorvastatin 10 MG tablet Commonly known as: LIPITOR Take 10 mg by mouth daily.   cefdinir 300 MG capsule Commonly known as: OMNICEF Take 1 capsule (300 mg total) by mouth every 12 (twelve) hours.   glipiZIDE 10 MG 24 hr tablet Commonly known as: GLUCOTROL XL Take 10 mg by mouth 2 (two) times daily.   lisinopril 40 MG tablet Commonly known as: ZESTRIL Take 40 mg by mouth daily.   omeprazole 20 MG capsule Commonly known as: PRILOSEC Take 20 mg by mouth daily.   propranolol 10 MG tablet Commonly known as: INDERAL Take 10 mg by mouth 3 (three) times daily.   tamsulosin 0.4 MG Caps capsule Commonly known as: FLOMAX Take 0.4 mg by mouth daily.        Discharge Exam: Filed Weights   05/02/22 1611  Weight: 102.1 kg   HEENT:  [place]/AT, No thrush, no icterus CV:  RRR, no rub, no S3, no S4 Lung:  CTA, no wheeze, no rhonchi Abd:  soft/+BS, NT Ext:  No edema, no lymphangitis, no synovitis, no rash Scrotum without erythema, drainage, induration  Condition at discharge: good  The results of significant diagnostics from this hospitalization (including imaging, microbiology, ancillary and laboratory)  are listed below for reference.   Imaging Studies: CT PELVIS W CONTRAST  Result Date: 05/02/2022 CLINICAL DATA:  Testicular pain and swelling.  Concern for abscess. EXAM: CT PELVIS WITH CONTRAST TECHNIQUE: Multidetector CT imaging of the pelvis was performed using the standard protocol following the bolus administration of intravenous contrast. RADIATION DOSE REDUCTION: This exam was performed according to the departmental dose-optimization program which includes automated exposure control,  adjustment of the mA and/or kV according to patient size and/or use of iterative reconstruction technique. CONTRAST:  125mL OMNIPAQUE IOHEXOL 300 MG/ML  SOLN COMPARISON:  Scrotal ultrasound dated 05/02/2022. FINDINGS: Urinary Tract:  No abnormality visualized. Bowel:  Unremarkable visualized pelvic bowel loops. Vascular/Lymphatic: No pathologically enlarged lymph nodes. No significant vascular abnormality seen. Reproductive: The prostate and seminal vesicles are grossly remarkable. Minimal thickened appearance of the scrotal wall with small bilateral hydroceles. No drainable fluid collection or abscess. There is slight prominence of the left spermatic cord. Correlation with urinalysis recommended to exclude an infectious process. Other: Small fat containing left inguinal hernia. Postsurgical changes in the region of the right groin. Musculoskeletal: No suspicious bone lesions identified. IMPRESSION: Minimal thickened appearance of the scrotal wall with small bilateral hydroceles. Findings may represent cellulitis. No drainable fluid collection or abscess. Electronically Signed   By: Anner Crete M.D.   On: 05/02/2022 19:59   US THYROID  Result Date: 04/18/2022 CLINICAL DATA:  Thyroid nodule by chest CT EXAM: THYROID ULTRASOUND TECHNIQUE: Ultrasound examination of the thyroid gland and adjacent soft tissues was performed. COMPARISON:  02/28/2022 FINDINGS: Parenchymal Echotexture: Moderately heterogenous Isthmus: 3 mm Right lobe:   5.3 x 2.2 x 2.0 cm Left lobe:  6.2 x 3.4 x 2.7 cm _________________________________________________________ Estimated total number of nodules >/= 1 cm: 3 Number of spongiform nodules >/=  2 cm not described below (TR1): 0 Number of mixed cystic and solid nodules >/= 1.5 cm not described below (TR2): 0 _________________________________________________________ Nodule # 1: Location: Isthmus; Mid Maximum size: 2.0 cm; Other 2 dimensions: 1.4 x 1.2 cm Composition: solid/almost completely  solid (2) Echogenicity: isoechoic (1) Shape: not taller-than-wide (0) Margins: ill-defined (0) Echogenic foci: none (0) ACR TI-RADS total points: 3. ACR TI-RADS risk category: TR3 (3 points). ACR TI-RADS recommendations: *Given size (>/= 1.5 - 2.4 cm) and appearance, a follow-up ultrasound in 1 year should be considered based on TI-RADS criteria. _________________________________________________________ Nodule # 5: Location: Right; Inferior Maximum size: 1.2 cm; Other 2 dimensions: 1.1 x 1.1 cm Composition: solid/almost completely solid (2) Echogenicity: isoechoic (1) Shape: not taller-than-wide (0) Margins: ill-defined (0) Echogenic foci: none (0) ACR TI-RADS total points: 3. ACR TI-RADS risk category: TR3 (3 points). ACR TI-RADS recommendations: Given size (<1.4 cm) and appearance, this nodule does NOT meet TI-RADS criteria for biopsy or dedicated follow-up. _________________________________________________________ Nodule # 6: Location: Left; Inferior Maximum size: 3.5 cm; Other 2 dimensions: 2.9 x 2.3 cm Composition: solid/almost completely solid (2) Echogenicity: isoechoic (1) Shape: taller-than-wide (3) Margins: ill-defined (0) Echogenic foci: macrocalcifications (1) ACR TI-RADS total points: 7. ACR TI-RADS risk category: TR5 (>/= 7 points). ACR TI-RADS recommendations: **Given size (>/= 1.0 cm) and appearance, fine needle aspiration of this highly suspicious nodule should be considered based on TI-RADS criteria. _________________________________________________________ Additional right thyroid subcentimeter spongiform and isoechoic nodules noted, all measuring 9 mm or less. These would not meet criteria for any follow-up or biopsy. No hypervascularity.  No regional adenopathy. IMPRESSION: 3.5 cm left inferior TR 5 nodule meets criteria for biopsy as above. This correlates with the dominant  CTA finding. 2 cm mid isthmus TR 3 nodule meets criteria follow-up in 1 year. The above is in keeping with the ACR TI-RADS  recommendations - J Am Coll Radiol 2017;14:587-595. Electronically Signed   By: Jerilynn Mages.  Shick M.D.   On: 04/18/2022 13:34   US SCROTUM W/DOPPLER  Result Date: 05/02/2022 CLINICAL DATA:  testicle pain, LEFT EXAM: SCROTAL ULTRASOUND DOPPLER ULTRASOUND OF THE TESTICLES TECHNIQUE: Complete ultrasound examination of the testicles, epididymis, and other scrotal structures was performed. Color and spectral Doppler ultrasound were also utilized to evaluate blood flow to the testicles. COMPARISON:  None Available. FINDINGS: Right testicle Measurements: 4.5 x 2.1 x 2.6 cm. No suspicious mass or microlithiasis visualized. There is incidental note of a cyst which measures 7 x 5 x 5 mm. Left testicle Measurements: 4.1 x 2.6 x 2.8 cm. No suspicious mass or microlithiasis visualized. There is incidental note of a cyst which measures 8 x 9 x 7 mm. Right epididymis: Normal in size and appearance. Benign epididymal cyst versus spermatocele is noted which measures 6 x 4 x 6 mm. Left epididymis: Normal in size and appearance. Benign epididymal cysts versus spermatoceles noted which measures 7 x 5 x 4 mm. Hydrocele:  No significant hydrocele is visualized. Varicocele:  None visualized. Pulsed Doppler interrogation of both testes demonstrates normal low resistance arterial and venous waveforms bilaterally. Skin appears mildly thickened on the LEFT in comparison to the RIGHT. IMPRESSION: 1. No evidence of testicular torsion. 2. LEFT skin appears mildly thickened in comparison to the contralateral side. This could reflect cellulitis in the appropriate clinical setting. Recommend correlation with physical exam. Electronically Signed   By: Valentino Saxon M.D.   On: 05/02/2022 17:08    Microbiology: Results for orders placed or performed during the hospital encounter of 05/02/22  Urine Culture     Status: None   Collection Time: 05/02/22  4:31 PM   Specimen: Urine, Clean Catch  Result Value Ref Range Status   Specimen Description    Final    URINE, CLEAN CATCH Performed at [facility], [address] [place], [place] [address]    Special Requests   Final    NONE Performed at [facility], [address], [place], [place] [address]    Culture   Final    NO GROWTH Performed at [facility], [place] [address], [place], [place] [address]    Report Status 05/03/2022 FINAL  Final    Labs: CBC: Recent Labs  Lab 05/02/22 1621 05/03/22 0325 05/04/22 0329  WBC 13.7* 14.6* 11.0*  NEUTROABS 10.6*  --   --   HGB 12.6* 11.9* 10.8*  HCT 38.0* 35.7* 34.2*  MCV 92.7 92.7 94.0  PLT 166 162 [id number]*   Basic Metabolic Panel: Recent Labs  Lab 05/02/22 1621 05/03/22 0325 05/04/22 0329  NA 137 134* 136  K 4.0 4.2 4.2  CL 103 102 108  CO2 24 23 21*  GLUCOSE 166* 220* 214*  BUN 9 13 15   CREATININE 1.34* 1.36* 1.32*  CALCIUM 9.3 8.8* 8.4*   Liver Function Tests: Recent Labs  Lab 05/02/22 1621  AST 32  ALT 32  ALKPHOS 83  BILITOT 0.8  PROT 7.7  ALBUMIN 4.2   CBG: Recent Labs  Lab 05/03/22 0734 05/03/22 1126 05/03/22 1635 05/03/22 2100 05/04/22 0724  GLUCAP 254* 144* 190* 204* 161*    Discharge time spent: greater than 30 minutes.  Signed: Orson Eva, MD [facility] 05/04/2022

## 2022-05-04 NOTE — Progress Notes (Signed)
Patient had uneventful shift.  Received tylenol for prn pain.  Vitals have been stable this shift.

## 2022-05-04 NOTE — Assessment & Plan Note (Signed)
Baseline creatinine 1.3-1.5 

## 2022-05-11 ENCOUNTER — Telehealth (HOSPITAL_COMMUNITY): Payer: Self-pay | Admitting: Emergency Medicine

## 2022-05-11 NOTE — Telephone Encounter (Signed)
Reaching out to patient to offer assistance regarding upcoming cardiac imaging study; pt verbalizes understanding of appt date/time, parking situation and where to check in, pre-test NPO status and medications ordered, and verified current allergies; name and call back number provided for further questions should they arise ?Laneice Meneely RN Navigator Cardiac Imaging ?[facility] ?[phone] office ?[phone] cell ? ?Denies iv issues ?100mg metoprolol tartrate  ?Arrival 245 ?

## 2022-05-15 ENCOUNTER — Ambulatory Visit (HOSPITAL_COMMUNITY)
Admission: RE | Admit: 2022-05-15 | Discharge: 2022-05-15 | Disposition: A | Discharge status: Home / Self Care | Payer: Medicare HMO | Source: Ambulatory Visit | Attending: Interventional Cardiology | Admitting: Interventional Cardiology

## 2022-05-15 ENCOUNTER — Encounter (HOSPITAL_COMMUNITY): Payer: Self-pay

## 2022-05-15 DIAGNOSIS — R072 Precordial pain: Secondary | ICD-10-CM | POA: Insufficient documentation

## 2022-05-15 MED ORDER — NITROGLYCERIN 0.4 MG SL SUBL
SUBLINGUAL_TABLET | SUBLINGUAL | Status: AC
  Filled 2022-05-15: qty 2

## 2022-05-15 MED ORDER — NITROGLYCERIN 0.4 MG SL SUBL
0.8000 mg | SUBLINGUAL_TABLET | Freq: Once | SUBLINGUAL | Status: AC
  Administered 2022-05-15: 0.8 mg via SUBLINGUAL

## 2022-05-15 MED ORDER — IOHEXOL 350 MG/ML SOLN
100.0000 mL | Freq: Once | INTRAVENOUS | Status: AC | PRN
  Administered 2022-05-15: 100 mL via INTRAVENOUS

## 2022-05-16 DIAGNOSIS — N492 Inflammatory disorders of scrotum: Secondary | ICD-10-CM | POA: Diagnosis not present

## 2022-07-03 DIAGNOSIS — M75102 Unspecified rotator cuff tear or rupture of left shoulder, not specified as traumatic: Secondary | ICD-10-CM | POA: Diagnosis not present

## 2022-07-03 DIAGNOSIS — K4090 Unilateral inguinal hernia, without obstruction or gangrene, not specified as recurrent: Secondary | ICD-10-CM | POA: Diagnosis not present

## 2022-07-03 DIAGNOSIS — Z86010 Personal history of colonic polyps: Secondary | ICD-10-CM | POA: Diagnosis not present

## 2022-07-03 DIAGNOSIS — N401 Enlarged prostate with lower urinary tract symptoms: Secondary | ICD-10-CM | POA: Diagnosis not present

## 2022-07-03 DIAGNOSIS — E782 Mixed hyperlipidemia: Secondary | ICD-10-CM | POA: Diagnosis not present

## 2022-07-03 DIAGNOSIS — I2510 Atherosclerotic heart disease of native coronary artery without angina pectoris: Secondary | ICD-10-CM | POA: Diagnosis not present

## 2022-07-03 DIAGNOSIS — E1165 Type 2 diabetes mellitus with hyperglycemia: Secondary | ICD-10-CM | POA: Diagnosis not present

## 2022-07-03 DIAGNOSIS — I10 Essential (primary) hypertension: Secondary | ICD-10-CM | POA: Diagnosis not present

## 2022-07-03 DIAGNOSIS — E1169 Type 2 diabetes mellitus with other specified complication: Secondary | ICD-10-CM | POA: Diagnosis not present

## 2022-07-03 DIAGNOSIS — G250 Essential tremor: Secondary | ICD-10-CM | POA: Diagnosis not present

## 2022-07-03 DIAGNOSIS — M75101 Unspecified rotator cuff tear or rupture of right shoulder, not specified as traumatic: Secondary | ICD-10-CM | POA: Diagnosis not present

## 2022-07-03 DIAGNOSIS — Z1211 Encounter for screening for malignant neoplasm of colon: Secondary | ICD-10-CM | POA: Diagnosis not present

## 2022-07-24 DIAGNOSIS — M25512 Pain in left shoulder: Secondary | ICD-10-CM | POA: Diagnosis not present

## 2022-07-24 DIAGNOSIS — M25511 Pain in right shoulder: Secondary | ICD-10-CM | POA: Diagnosis not present

## 2022-08-07 IMAGING — CT CT HEART MORP W/ CTA COR W/ SCORE W/ CA W/CM &/OR W/O CM
4 of 7 series · 8 of 20 positions shown, 9 images · IV contrast (APPLIED)
Comparison: Chest CT on 02/28/2022
COMPARISON: Chest CT on 02/28/2022

Addendum:
EXAM:
OVER-READ INTERPRETATION  CT CHEST

The following report is a limited chest CT over-read performed by
This over-read does not include interpretation of cardiac or
coronary anatomy or pathology . The interpretation by the
cardiologist is attached.
CLINICAL DATA: 66M with chest pain
Cardiac/Coronary CTA
TECHNIQUE: The patient was scanned on a Phillips Force scanner.

[Series 6: best diast · axial · 0.39mm/px · z∈[+1319,+1362]mm · 2 of 326 slices shown, 3 images]
[im 109/326  vessel]
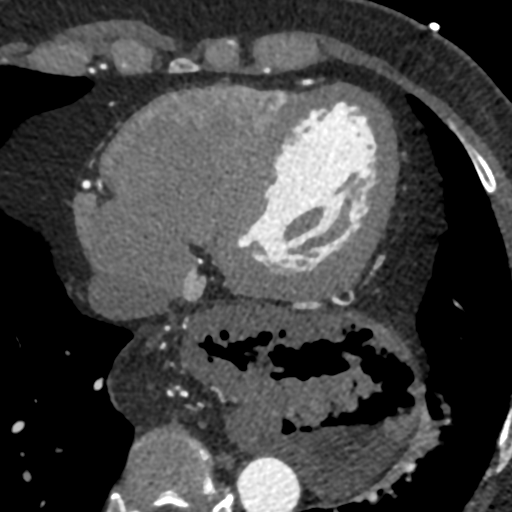
[im 109/326  lung]
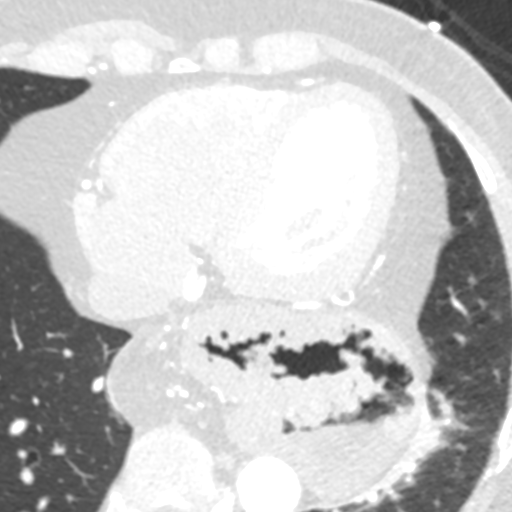
[im 217/326  vessel]
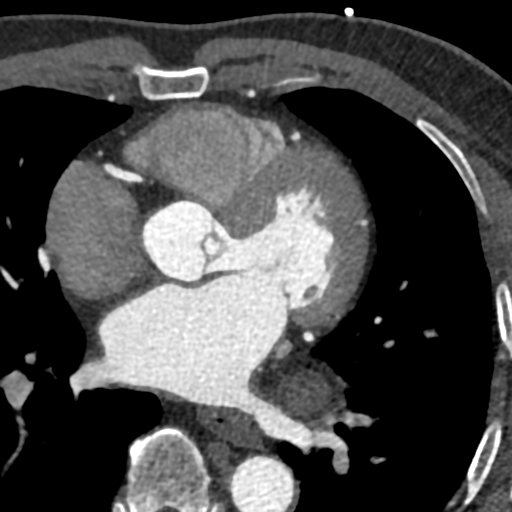

[Series 7: best syst · axial · 0.39mm/px · z∈[+1319,+1362]mm · 2 of 326 slices shown]
[im 109/326  vessel]
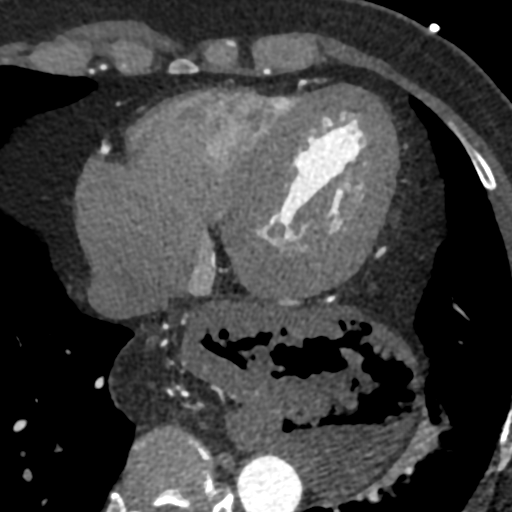
[im 217/326  vessel]
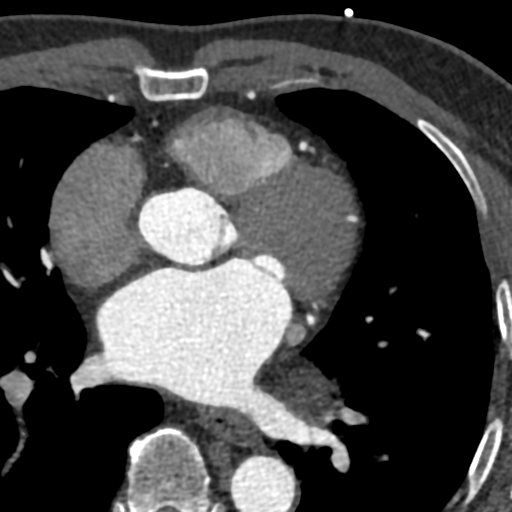

[Series 8: ts diast sharp · axial · 0.39mm/px · z∈[+1319,+1362]mm · 2 of 326 slices shown]
[im 109/326  lung]
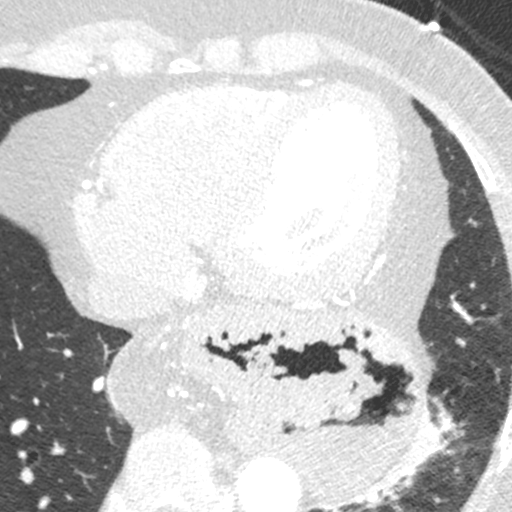
[im 217/326  lung]
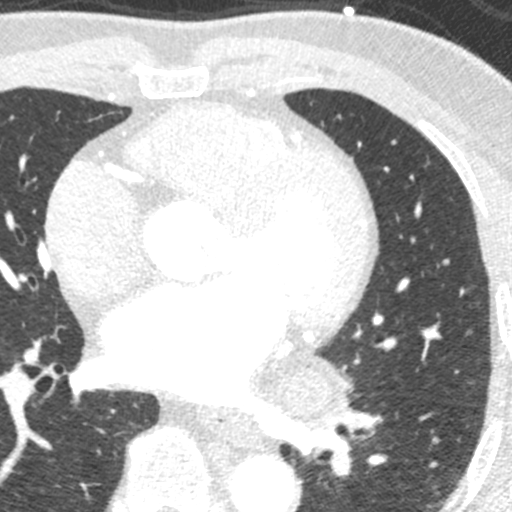

[Series 9: ts syst sharp · axial · 0.39mm/px · z∈[+1319,+1362]mm · 2 of 326 slices shown]
[im 109/326  lung]
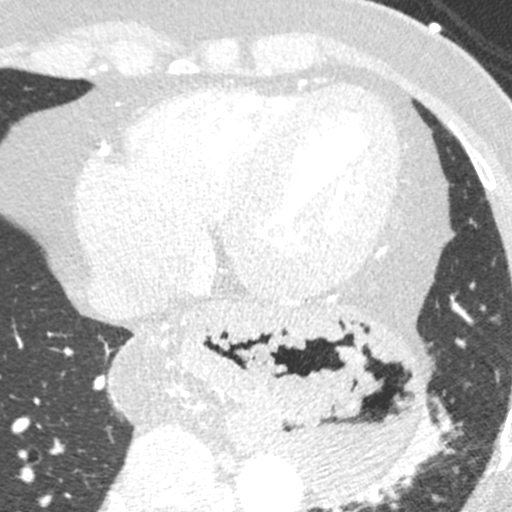
[im 217/326  lung]
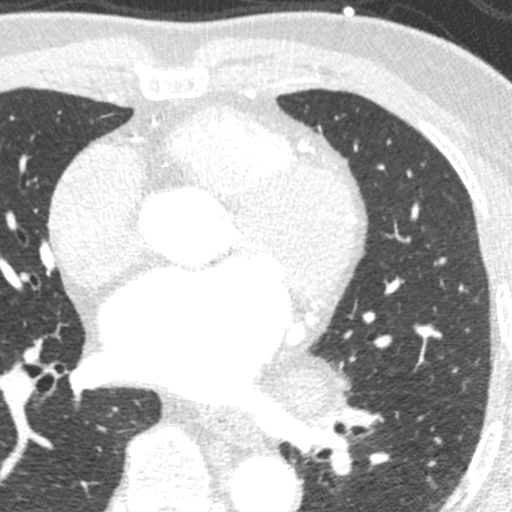

[8 of 20 positions shown; findings below may reference images not displayed]

FINDINGS: No suspicious nodules, masses, or infiltrates are identified in the
visualized portion of the lungs. No pleural fluid seen. Stable mild
scarring in anterior left lower lobe.

A large hiatal hernia is again seen containing nearly the entire
stomach. This shows no significant change compared to prior exam.
IMPRESSION: Stable large hiatal hernia containing nearly the entire stomach.
FINDINGS: A 100 kV prospective scan was triggered in the descending thoracic
aorta at 111 HU's. Axial non-contrast 3 mm slices were carried out
through the heart. The data set was analyzed on a dedicated work
station and scored using the Agatson method. Gantry rotation speed
was 250 msecs and collimation was .6 mm. 0.8 mg of sl NTG was given.
The 3D data set was reconstructed in 5% intervals of the 35-75 % of
the R-R cycle. Phases were analyzed on a dedicated work station
using MPR, MIP and VRT modes. The patient received 80 cc of
contrast.

Coronary Arteries:  Normal coronary origin.  Codominance.

RCA is a codominant artery that gives rise to PDA. There is no
plaque.

Left main is a large artery that gives rise to LAD and LCX arteries.

LAD is a large vessel. Calcified plaque in the proximal LAD causes
0-24% stenosis. Calcified plaque in the mid LAD causes 0-24%
stenosis

LCX is a codominant artery. Noncalcified plaque in proximal LCX
causes 0-24% stenosis.

Other findings:

Left Ventricle: Normal size

Left Atrium: Mild enlargement

Pulmonary Veins: Normal configuration

Right Ventricle: Mild enlargement

Right Atrium: Mild enlargement

Cardiac valves: No calcifications

Thoracic aorta: Normal size

Pulmonary Arteries: Normal size

Systemic Veins: Normal drainage

Pericardium: Normal thickness
IMPRESSION: 1. Coronary calcium score of 39. This was 59th percentile for age
and sex matched control.

2. Normal coronary origin with codominance.

3. Nonobstructive CAD with calcified plaque causing minimal (0-24%)
stenosis in proximal and mid LAD, and noncalcified plaque causing
minimal (0-24%) stenosis in proximal LCX

CAD-RADS 1. Minimal non-obstructive CAD (0-24%). Consider
non-atherosclerotic causes of chest pain. Consider preventive
therapy and risk factor modification.

*** End of Addendum ***
EXAM:
OVER-READ INTERPRETATION  CT CHEST

The following report is a limited chest CT over-read performed by
This over-read does not include interpretation of cardiac or
coronary anatomy or pathology . The interpretation by the
cardiologist is attached.
FINDINGS: No suspicious nodules, masses, or infiltrates are identified in the
visualized portion of the lungs. No pleural fluid seen. Stable mild
scarring in anterior left lower lobe.

A large hiatal hernia is again seen containing nearly the entire
stomach. This shows no significant change compared to prior exam.
IMPRESSION: Stable large hiatal hernia containing nearly the entire stomach.

## 2022-11-26 DIAGNOSIS — E1169 Type 2 diabetes mellitus with other specified complication: Secondary | ICD-10-CM | POA: Diagnosis not present

## 2022-11-26 DIAGNOSIS — I2510 Atherosclerotic heart disease of native coronary artery without angina pectoris: Secondary | ICD-10-CM | POA: Diagnosis not present

## 2022-11-28 DIAGNOSIS — I252 Old myocardial infarction: Secondary | ICD-10-CM | POA: Diagnosis not present

## 2022-11-28 DIAGNOSIS — I25119 Atherosclerotic heart disease of native coronary artery with unspecified angina pectoris: Secondary | ICD-10-CM | POA: Diagnosis not present

## 2022-11-28 DIAGNOSIS — N182 Chronic kidney disease, stage 2 (mild): Secondary | ICD-10-CM | POA: Diagnosis not present

## 2022-11-28 DIAGNOSIS — I129 Hypertensive chronic kidney disease with stage 1 through stage 4 chronic kidney disease, or unspecified chronic kidney disease: Secondary | ICD-10-CM | POA: Diagnosis not present

## 2022-11-28 DIAGNOSIS — K219 Gastro-esophageal reflux disease without esophagitis: Secondary | ICD-10-CM | POA: Diagnosis not present

## 2022-11-28 DIAGNOSIS — G250 Essential tremor: Secondary | ICD-10-CM | POA: Diagnosis not present

## 2022-11-28 DIAGNOSIS — R32 Unspecified urinary incontinence: Secondary | ICD-10-CM | POA: Diagnosis not present

## 2022-11-28 DIAGNOSIS — E1122 Type 2 diabetes mellitus with diabetic chronic kidney disease: Secondary | ICD-10-CM | POA: Diagnosis not present

## 2022-11-28 DIAGNOSIS — N529 Male erectile dysfunction, unspecified: Secondary | ICD-10-CM | POA: Diagnosis not present

## 2022-11-28 DIAGNOSIS — N400 Enlarged prostate without lower urinary tract symptoms: Secondary | ICD-10-CM | POA: Diagnosis not present

## 2022-11-28 DIAGNOSIS — E669 Obesity, unspecified: Secondary | ICD-10-CM | POA: Diagnosis not present

## 2022-11-28 DIAGNOSIS — E785 Hyperlipidemia, unspecified: Secondary | ICD-10-CM | POA: Diagnosis not present

## 2022-12-03 DIAGNOSIS — E782 Mixed hyperlipidemia: Secondary | ICD-10-CM | POA: Diagnosis not present

## 2022-12-03 DIAGNOSIS — I2510 Atherosclerotic heart disease of native coronary artery without angina pectoris: Secondary | ICD-10-CM | POA: Diagnosis not present

## 2022-12-03 DIAGNOSIS — Z86010 Personal history of colonic polyps: Secondary | ICD-10-CM | POA: Diagnosis not present

## 2022-12-03 DIAGNOSIS — Z1211 Encounter for screening for malignant neoplasm of colon: Secondary | ICD-10-CM | POA: Diagnosis not present

## 2022-12-03 DIAGNOSIS — E1129 Type 2 diabetes mellitus with other diabetic kidney complication: Secondary | ICD-10-CM | POA: Diagnosis not present

## 2022-12-03 DIAGNOSIS — D649 Anemia, unspecified: Secondary | ICD-10-CM | POA: Diagnosis not present

## 2022-12-03 DIAGNOSIS — R809 Proteinuria, unspecified: Secondary | ICD-10-CM | POA: Diagnosis not present

## 2022-12-03 DIAGNOSIS — I10 Essential (primary) hypertension: Secondary | ICD-10-CM | POA: Diagnosis not present

## 2022-12-03 DIAGNOSIS — M75101 Unspecified rotator cuff tear or rupture of right shoulder, not specified as traumatic: Secondary | ICD-10-CM | POA: Diagnosis not present

## 2022-12-03 DIAGNOSIS — G250 Essential tremor: Secondary | ICD-10-CM | POA: Diagnosis not present

## 2022-12-03 DIAGNOSIS — N401 Enlarged prostate with lower urinary tract symptoms: Secondary | ICD-10-CM | POA: Diagnosis not present

## 2022-12-03 DIAGNOSIS — K4090 Unilateral inguinal hernia, without obstruction or gangrene, not specified as recurrent: Secondary | ICD-10-CM | POA: Diagnosis not present

## 2022-12-06 ENCOUNTER — Encounter: Payer: Medicare HMO | Attending: Internal Medicine | Admitting: Nutrition

## 2022-12-06 DIAGNOSIS — Z713 Dietary counseling and surveillance: Secondary | ICD-10-CM | POA: Insufficient documentation

## 2022-12-06 DIAGNOSIS — E119 Type 2 diabetes mellitus without complications: Secondary | ICD-10-CM | POA: Diagnosis not present

## 2022-12-06 DIAGNOSIS — E1122 Type 2 diabetes mellitus with diabetic chronic kidney disease: Secondary | ICD-10-CM | POA: Diagnosis not present

## 2022-12-06 DIAGNOSIS — N1831 Chronic kidney disease, stage 3a: Secondary | ICD-10-CM | POA: Diagnosis not present

## 2022-12-06 DIAGNOSIS — E669 Obesity, unspecified: Secondary | ICD-10-CM | POA: Insufficient documentation

## 2022-12-06 DIAGNOSIS — E1165 Type 2 diabetes mellitus with hyperglycemia: Secondary | ICD-10-CM

## 2022-12-06 DIAGNOSIS — I10 Essential (primary) hypertension: Secondary | ICD-10-CM

## 2022-12-06 NOTE — Progress Notes (Signed)
Medical Nutrition Therapy  Appointment Start time:  0800  Appointment End time:  0900  Primary concerns today:  Dm Type 2, Obesity  Referral diagnosis: E11.8, E66.9 Preferred learning style: NO preference  Learning readiness: Ready    NUTRITION ASSESSMENT  66 yr old bmale here for help with his DM and and desired weight loss. PCP Dr. Margo Aye. Currently on Glipizide 10 mg BID. PMH: Type 2 DM, Obesity, CKD Stg 3a.  Last A1C 9.5%. He says he has a meter but hasn't been testing. Not sure how to use it. He notes he does need to improve his eating habits and drinking more water.   He eats his meals randomly. No set schedule. Skips meals at times. Eats some foods at home and mostly out fast food or pick up  meals.   He is willing to work with lifestyle medicine to reverse his chronic diseases and improve his overall health.  Anthropometrics Clinical Medical Hx: See chart Medications: Glipizide Labs: A1C 9.5% Notable Signs/Symptoms: None  Lifestyle & Dietary Hx Married. Lives with his wife.  Estimated daily fluid intake: 40 oz Supplements:  Sleep: varies 5-6  Stress / self-care: his  health Current average weekly physical activity: ADL, does walk some  24-Hr Dietary Recall Eats 2-3 meals per day. Drinks water, diet sodas, sweet tea  Estimated Energy Needs Calories: 1600 Carbohydrate: 180g Protein: 120g Fat: 44g   NUTRITION DIAGNOSIS  NB-1.1 Food and nutrition-related knowledge deficit As related to Diabetes .  As evidenced by A1c 7.7%.   NUTRITION INTERVENTION  Nutrition education (E-1) on the following topics:  Nutrition and Diabetes education provided on My Plate, CHO counting, meal planning, portion sizes, timing of meals, avoiding snacks between meals unless having a low blood sugar, target ranges for A1C and blood sugars, signs/symptoms and treatment of hyper/hypoglycemia, monitoring blood sugars, taking medications as prescribed, benefits of exercising 30 minutes per day  and prevention of complications of DM.  Lifestyle Medicine  - Whole Food, Plant Predominant Nutrition is highly recommended: Eat Plenty of vegetables, Mushrooms, fruits, Legumes, Whole Grains, Nuts, seeds in lieu of processed meats, processed snacks/pastries red meat, poultry, eggs.    -It is better to avoid simple carbohydrates including: Cakes, Sweet Desserts, Ice Cream, Soda (diet and regular), Sweet Tea, Candies, Chips, Cookies, Store Bought Juices, Alcohol in Excess of  1-2 drinks a day, Lemonade,  Artificial Sweeteners, Doughnuts, Coffee Creamers, "Sugar-free" Products, etc, etc.  This is not a complete list.....  Exercise: If you are able: 30 -60 minutes a day ,4 days a week, or 150 minutes a week.  The longer the better.  Combine stretch, strength, and aerobic activities.  If you were told in the past that you have high risk for cardiovascular diseases, you may seek evaluation by your heart doctor prior to initiating moderate to intense exercise programs.   Handouts Provided Include  Lifestyle Medicine  Learning Style & Readiness for Change Teaching method utilized: Visual & Auditory  Demonstrated degree of understanding via: Teach Back  Barriers to learning/adherence to lifestyle change: None  Goals Established by Pt Goals  Eat three meals per day; don't skip meals Focus on more whole food plant based meal choices-more fruits, vegetables and whole grains. Cut out processed foods, fast foods, and junk food Drink only water. No soda, juices, tea or sugar free drinks Walk 30 minutes a day. Test blood sugars twice a day. Goal is less than 130 in am and less than 180 at bedtime for now.  MONITORING & EVALUATION Dietary intake, weekly physical activity, and blood sugars in 1 month.  Next Steps  Patient is to work on choosing better food choices.Marland Kitchen

## 2022-12-25 ENCOUNTER — Ambulatory Visit: Payer: Medicare HMO | Admitting: Nutrition

## 2022-12-28 DIAGNOSIS — E1165 Type 2 diabetes mellitus with hyperglycemia: Secondary | ICD-10-CM | POA: Diagnosis not present

## 2022-12-28 DIAGNOSIS — Z7985 Long-term (current) use of injectable non-insulin antidiabetic drugs: Secondary | ICD-10-CM | POA: Diagnosis not present

## 2023-01-01 ENCOUNTER — Encounter: Payer: Self-pay | Admitting: Nutrition

## 2023-01-01 NOTE — Patient Instructions (Addendum)
Goals  Eat three meals per day; don't skip meals Focus on more whole food plant based meal choices-more fruits, vegetables and whole grains. Cut out processed foods, fast foods, and junk food Drink only water. No soda, juices, tea or sugar free drinks Walk 30 minutes a day. Test blood sugars twice a day. Goal is less than 130 in am and less than 180 at bedtime for now.

## 2023-01-02 ENCOUNTER — Encounter: Payer: Self-pay | Admitting: Nutrition

## 2023-01-02 ENCOUNTER — Encounter: Payer: Medicare HMO | Attending: Family Medicine | Admitting: Nutrition

## 2023-01-02 VITALS — Ht 70.0 in | Wt 226.0 lb

## 2023-01-02 DIAGNOSIS — N1831 Chronic kidney disease, stage 3a: Secondary | ICD-10-CM | POA: Diagnosis not present

## 2023-01-02 DIAGNOSIS — I10 Essential (primary) hypertension: Secondary | ICD-10-CM | POA: Insufficient documentation

## 2023-01-02 DIAGNOSIS — E1165 Type 2 diabetes mellitus with hyperglycemia: Secondary | ICD-10-CM | POA: Insufficient documentation

## 2023-01-02 NOTE — Progress Notes (Signed)
Medical Nutrition Therapy  Appointment Start time:  1430  Appointment End time:  1525  Primary concerns today:  Dm Type 2, Obesity  Referral diagnosis: E11.8, E66.9 Preferred learning style: NO preference  Learning readiness: Ready    NUTRITION ASSESSMENT Follow up Dm 67 yr old bmale here for help with his DM and and desired weight loss. PCP Dr. Nevada Crane. Currently on Glipizide 10 mg BID. Just started Ozempic last week.  No nausea or GI issues. He hasn't been able to test his blood sugars. He notes he can't get his machine to work with the strips he has. The pharmacy got him a new meter and is not compatible with the strips he has. Called [facility] and informed them of the problem. They will get him the correct meter for his One Touch Meter and not the Ultra Plus meter.  Diet remains inconsistent and not in accordance with lifestyle medicine to help improve his DM, Obesity, CVD and HTN.  He eats 1-2 meals per day. Eats late at night but not hungry in am. Craves sweets and salty foods. Admits to being thirsty and urinating a lot.   He is willing to work harder on choosing more whole food, plant based foods and lifestyle to improve his DM and chronic medical conditions. He would like to get off medications, rather than keep adding more. Stressed the  need for lifestyle changes are necessary to reduce his medication burden. Hasn't had a new A1C yet. Suppose to see PCP soon.   Needs to see eye doctor, foot doctor and dentist appts  in the near future.  Anthropometrics Clinical Medical Hx: See chart Medications: Glipizide Labs: A1C 9.5% Notable Signs/Symptoms: None  Lifestyle & Dietary Hx Married. Lives with his wife.  Estimated daily fluid intake: 40 oz Supplements:  Sleep: varies 5-6  Stress / self-care: his  health Current average weekly physical activity: ADL, does walk some  24-Hr Dietary Recall B) skipped Coffee-milk and sugar Lunch [place] leg, Mt Dew Coffee Dinner Pimento cheese  sandwich,  soda   Estimated Energy Needs Calories: 1600 Carbohydrate: 180g Protein: 120g Fat: 44g   NUTRITION DIAGNOSIS  NB-1.1 Food and nutrition-related knowledge deficit As related to Diabetes .  As evidenced by A1c 7.7%.   NUTRITION INTERVENTION  Nutrition education (E-1) on the following topics:  Nutrition and Diabetes education provided on My Plate, CHO counting, meal planning, portion sizes, timing of meals, avoiding snacks between meals unless having a low blood sugar, target ranges for A1C and blood sugars, signs/symptoms and treatment of hyper/hypoglycemia, monitoring blood sugars, taking medications as prescribed, benefits of exercising 30 minutes per day and prevention of complications of DM.  Lifestyle Medicine  - Whole Food, Plant Predominant Nutrition is highly recommended: Eat Plenty of vegetables, Mushrooms, fruits, Legumes, Whole Grains, Nuts, seeds in lieu of processed meats, processed snacks/pastries red meat, poultry, eggs.    -It is better to avoid simple carbohydrates including: Cakes, Sweet Desserts, Ice Cream, Soda (diet and regular), Sweet Tea, Candies, Chips, Cookies, Store Bought Juices, Alcohol in Excess of  1-2 drinks a day, Lemonade,  Artificial Sweeteners, Doughnuts, Coffee Creamers, "Sugar-free" Products, etc, etc.  This is not a complete list.....  Exercise: If you are able: 30 -60 minutes a day ,4 days a week, or 150 minutes a week.  The longer the better.  Combine stretch, strength, and aerobic activities.  If you were told in the past that you have high risk for cardiovascular diseases, you may seek evaluation by  your heart doctor prior to initiating moderate to intense exercise programs.   Handouts Provided Include  Lifestyle Medicine  Learning Style & Readiness for Change Teaching method utilized: Visual & Auditory  Demonstrated degree of understanding via: Teach Back  Barriers to learning/adherence to lifestyle change: None  Goals Established  by Pt Goals  Eat three meals per day; don't skip meals Focus on more whole food plant based meal choices-more fruits, vegetables and whole grains. Cut out processed foods, fast foods, and junk food Drink only water. No soda, juices, tea or sugar free drinks Walk 30 minutes a day. Test blood sugars twice a day. Goal is less than 130 in am and less than 180 at bedtime for now.   MONITORING & EVALUATION Dietary intake, weekly physical activity, and blood sugars in 1 month.  Next Steps  Patient is to work on choosing better food choices.Marland Kitchen

## 2023-01-02 NOTE — Patient Instructions (Signed)
Reapplying goals set last time;  Eat three meals per day; don't skip meals Focus on more whole food plant based meal choices-more fruits, vegetables and whole grains. Cut out processed foods, fast foods, and junk food Drink only water. No soda, juices, tea or sugar free drinks Walk 30 minutes a day. Test blood sugars twice a day. Goal is less than 130 in am and less than 180 at bedtime for now.

## 2023-02-05 ENCOUNTER — Encounter: Payer: Medicare HMO | Attending: Internal Medicine | Admitting: Nutrition

## 2023-02-05 VITALS — Ht 70.0 in | Wt 223.0 lb

## 2023-02-05 DIAGNOSIS — I10 Essential (primary) hypertension: Secondary | ICD-10-CM | POA: Insufficient documentation

## 2023-02-05 DIAGNOSIS — E1165 Type 2 diabetes mellitus with hyperglycemia: Secondary | ICD-10-CM | POA: Insufficient documentation

## 2023-02-05 DIAGNOSIS — N1831 Chronic kidney disease, stage 3a: Secondary | ICD-10-CM | POA: Insufficient documentation

## 2023-02-05 NOTE — Patient Instructions (Addendum)
Goals  Cut out snacks Healthy Choice Power bowl meals. CHeck blood sugars at night Talk to insurance about home meals and continuous glucose monitor with  your provider. Get A1C to 6.5% Choose high fiber foods.

## 2023-02-05 NOTE — Progress Notes (Unsigned)
Medical Nutrition Therapy  Appointment Start time:  1430  Appointment End time:  1525  Primary concerns today:  Dm Type 2, Obesity  Referral diagnosis: E11.8, E66.9 Preferred learning style: NO preference  Learning readiness: Ready    NUTRITION ASSESSMENT Follow up Dm 67 yr old bmale here for help with his DM and and desired weight loss. PCP Dr. Nevada Crane. Currently on Glipizide 10 mg BID. Just started Ozempic last week.  No nausea or GI issues. Lost 3 lbs.  Has been working out now.and getting in cardio and strength training. Testing blood sugars.       He hasn't been able to test his blood sugars. He notes he can't get his machine to work with the strips he has. The pharmacy got him a new meter and is not compatible with the strips he has. Called [facility] and informed them of the problem. They will get him the correct meter for his One Touch Meter and not the Ultra Plus meter.  Diet remains inconsistent and not in accordance with lifestyle medicine to help improve his DM, Obesity, CVD and HTN.  He eats 1-2 meals per day. Eats late at night but not hungry in am. Craves sweets and salty foods. Admits to being thirsty and urinating a lot.   He is willing to work harder on choosing more whole food, plant based foods and lifestyle to improve his DM and chronic medical conditions. He would like to get off medications, rather than keep adding more. Stressed the  need for lifestyle changes are necessary to reduce his medication burden. Hasn't had a new A1C yet. Suppose to see PCP soon.   Needs to see eye doctor, foot doctor and dentist appts  in the near future.  Anthropometrics Clinical Medical Hx: See chart Medications: Glipizide Labs: A1C 9.5% Notable Signs/Symptoms: None  Lifestyle & Dietary Hx Married. Lives with his wife.  Estimated daily fluid intake: 40 oz Supplements:  Sleep: varies 5-6  Stress / self-care: his  health Current average weekly physical activity: ADL, does walk  some  24-Hr Dietary Recall B) skipped Coffee-milk and sugar Lunch [place] leg, Mt Dew Coffee Dinner Pimento cheese sandwich,  soda   Estimated Energy Needs Calories: 1600 Carbohydrate: 180g Protein: 120g Fat: 44g   NUTRITION DIAGNOSIS  NB-1.1 Food and nutrition-related knowledge deficit As related to Diabetes .  As evidenced by A1c 7.7%.   NUTRITION INTERVENTION  Nutrition education (E-1) on the following topics:  Nutrition and Diabetes education provided on My Plate, CHO counting, meal planning, portion sizes, timing of meals, avoiding snacks between meals unless having a low blood sugar, target ranges for A1C and blood sugars, signs/symptoms and treatment of hyper/hypoglycemia, monitoring blood sugars, taking medications as prescribed, benefits of exercising 30 minutes per day and prevention of complications of DM.  Lifestyle Medicine  - Whole Food, Plant Predominant Nutrition is highly recommended: Eat Plenty of vegetables, Mushrooms, fruits, Legumes, Whole Grains, Nuts, seeds in lieu of processed meats, processed snacks/pastries red meat, poultry, eggs.    -It is better to avoid simple carbohydrates including: Cakes, Sweet Desserts, Ice Cream, Soda (diet and regular), Sweet Tea, Candies, Chips, Cookies, Store Bought Juices, Alcohol in Excess of  1-2 drinks a day, Lemonade,  Artificial Sweeteners, Doughnuts, Coffee Creamers, "Sugar-free" Products, etc, etc.  This is not a complete list.....  Exercise: If you are able: 30 -60 minutes a day ,4 days a week, or 150 minutes a week.  The longer the better.  Combine stretch, strength,  and aerobic activities.  If you were told in the past that you have high risk for cardiovascular diseases, you may seek evaluation by your heart doctor prior to initiating moderate to intense exercise programs.   Handouts Provided Include  Lifestyle Medicine  Learning Style & Readiness for Change Teaching method utilized: Visual & Auditory  Demonstrated  degree of understanding via: Teach Back  Barriers to learning/adherence to lifestyle change: None  Goals Established by Pt Goals  Eat three meals per day; don't skip meals Focus on more whole food plant based meal choices-more fruits, vegetables and whole grains. Cut out processed foods, fast foods, and junk food Drink only water. No soda, juices, tea or sugar free drinks Walk 30 minutes a day. Test blood sugars twice a day. Goal is less than 130 in am and less than 180 at bedtime for now.   MONITORING & EVALUATION Dietary intake, weekly physical activity, and blood sugars in 1 month.  Next Steps  Patient is to work on choosing better food choices.Marland Kitchen

## 2023-02-06 ENCOUNTER — Encounter: Payer: Self-pay | Admitting: Nutrition

## 2023-02-11 DIAGNOSIS — H52223 Regular astigmatism, bilateral: Secondary | ICD-10-CM | POA: Diagnosis not present

## 2023-02-25 DIAGNOSIS — E1129 Type 2 diabetes mellitus with other diabetic kidney complication: Secondary | ICD-10-CM | POA: Diagnosis not present

## 2023-02-25 DIAGNOSIS — I2510 Atherosclerotic heart disease of native coronary artery without angina pectoris: Secondary | ICD-10-CM | POA: Diagnosis not present

## 2023-03-04 DIAGNOSIS — Z86010 Personal history of colonic polyps: Secondary | ICD-10-CM | POA: Diagnosis not present

## 2023-03-04 DIAGNOSIS — E1129 Type 2 diabetes mellitus with other diabetic kidney complication: Secondary | ICD-10-CM | POA: Diagnosis not present

## 2023-03-04 DIAGNOSIS — K449 Diaphragmatic hernia without obstruction or gangrene: Secondary | ICD-10-CM | POA: Diagnosis not present

## 2023-03-04 DIAGNOSIS — N401 Enlarged prostate with lower urinary tract symptoms: Secondary | ICD-10-CM | POA: Diagnosis not present

## 2023-03-04 DIAGNOSIS — J439 Emphysema, unspecified: Secondary | ICD-10-CM | POA: Diagnosis not present

## 2023-03-04 DIAGNOSIS — E1165 Type 2 diabetes mellitus with hyperglycemia: Secondary | ICD-10-CM | POA: Diagnosis not present

## 2023-03-04 DIAGNOSIS — I10 Essential (primary) hypertension: Secondary | ICD-10-CM | POA: Diagnosis not present

## 2023-03-04 DIAGNOSIS — I2510 Atherosclerotic heart disease of native coronary artery without angina pectoris: Secondary | ICD-10-CM | POA: Diagnosis not present

## 2023-03-04 DIAGNOSIS — G250 Essential tremor: Secondary | ICD-10-CM | POA: Diagnosis not present

## 2023-03-04 DIAGNOSIS — E782 Mixed hyperlipidemia: Secondary | ICD-10-CM | POA: Diagnosis not present

## 2023-03-04 DIAGNOSIS — K4090 Unilateral inguinal hernia, without obstruction or gangrene, not specified as recurrent: Secondary | ICD-10-CM | POA: Diagnosis not present

## 2023-03-04 DIAGNOSIS — D649 Anemia, unspecified: Secondary | ICD-10-CM | POA: Diagnosis not present

## 2023-03-08 ENCOUNTER — Encounter: Payer: Self-pay | Admitting: *Deleted

## 2023-06-10 DIAGNOSIS — I2510 Atherosclerotic heart disease of native coronary artery without angina pectoris: Secondary | ICD-10-CM | POA: Diagnosis not present

## 2023-06-14 DIAGNOSIS — I2510 Atherosclerotic heart disease of native coronary artery without angina pectoris: Secondary | ICD-10-CM | POA: Diagnosis not present

## 2023-06-14 DIAGNOSIS — I10 Essential (primary) hypertension: Secondary | ICD-10-CM | POA: Diagnosis not present

## 2023-06-14 DIAGNOSIS — G250 Essential tremor: Secondary | ICD-10-CM | POA: Diagnosis not present

## 2023-06-14 DIAGNOSIS — N401 Enlarged prostate with lower urinary tract symptoms: Secondary | ICD-10-CM | POA: Diagnosis not present

## 2023-06-14 DIAGNOSIS — R809 Proteinuria, unspecified: Secondary | ICD-10-CM | POA: Diagnosis not present

## 2023-06-14 DIAGNOSIS — E782 Mixed hyperlipidemia: Secondary | ICD-10-CM | POA: Diagnosis not present

## 2023-06-14 DIAGNOSIS — E1129 Type 2 diabetes mellitus with other diabetic kidney complication: Secondary | ICD-10-CM | POA: Diagnosis not present

## 2023-06-14 DIAGNOSIS — Z86010 Personal history of colonic polyps: Secondary | ICD-10-CM | POA: Diagnosis not present

## 2023-06-14 DIAGNOSIS — Z1211 Encounter for screening for malignant neoplasm of colon: Secondary | ICD-10-CM | POA: Diagnosis not present

## 2023-06-14 DIAGNOSIS — K4090 Unilateral inguinal hernia, without obstruction or gangrene, not specified as recurrent: Secondary | ICD-10-CM | POA: Diagnosis not present

## 2023-07-08 ENCOUNTER — Ambulatory Visit: Payer: Medicare HMO | Admitting: Nutrition

## 2023-08-07 DIAGNOSIS — H52223 Regular astigmatism, bilateral: Secondary | ICD-10-CM | POA: Diagnosis not present

## 2023-08-07 DIAGNOSIS — H524 Presbyopia: Secondary | ICD-10-CM | POA: Diagnosis not present

## 2023-08-23 ENCOUNTER — Encounter: Payer: Self-pay | Admitting: *Deleted

## 2023-12-23 DIAGNOSIS — Z125 Encounter for screening for malignant neoplasm of prostate: Secondary | ICD-10-CM | POA: Diagnosis not present

## 2023-12-23 DIAGNOSIS — I2510 Atherosclerotic heart disease of native coronary artery without angina pectoris: Secondary | ICD-10-CM | POA: Diagnosis not present

## 2023-12-23 DIAGNOSIS — E1129 Type 2 diabetes mellitus with other diabetic kidney complication: Secondary | ICD-10-CM | POA: Diagnosis not present

## 2023-12-26 ENCOUNTER — Encounter: Payer: Self-pay | Admitting: *Deleted

## 2024-01-02 DIAGNOSIS — R2240 Localized swelling, mass and lump, unspecified lower limb: Secondary | ICD-10-CM | POA: Diagnosis not present

## 2024-01-02 DIAGNOSIS — L2989 Other pruritus: Secondary | ICD-10-CM | POA: Diagnosis not present

## 2024-01-02 DIAGNOSIS — M7989 Other specified soft tissue disorders: Secondary | ICD-10-CM | POA: Diagnosis not present

## 2024-01-08 ENCOUNTER — Ambulatory Visit (HOSPITAL_COMMUNITY)
Admission: RE | Admit: 2024-01-08 | Discharge: 2024-01-08 | Disposition: A | Discharge status: Home / Self Care | Payer: Medicare HMO | Source: Ambulatory Visit | Attending: Nurse Practitioner | Admitting: Nurse Practitioner

## 2024-01-08 ENCOUNTER — Other Ambulatory Visit (HOSPITAL_COMMUNITY): Payer: Self-pay | Admitting: Nurse Practitioner

## 2024-01-08 DIAGNOSIS — D649 Anemia, unspecified: Secondary | ICD-10-CM | POA: Diagnosis not present

## 2024-01-08 DIAGNOSIS — R2240 Localized swelling, mass and lump, unspecified lower limb: Secondary | ICD-10-CM | POA: Insufficient documentation

## 2024-01-08 DIAGNOSIS — G250 Essential tremor: Secondary | ICD-10-CM | POA: Diagnosis not present

## 2024-01-08 DIAGNOSIS — E1129 Type 2 diabetes mellitus with other diabetic kidney complication: Secondary | ICD-10-CM | POA: Diagnosis not present

## 2024-01-08 DIAGNOSIS — M7989 Other specified soft tissue disorders: Secondary | ICD-10-CM | POA: Diagnosis not present

## 2024-01-08 DIAGNOSIS — N401 Enlarged prostate with lower urinary tract symptoms: Secondary | ICD-10-CM | POA: Diagnosis not present

## 2024-01-08 DIAGNOSIS — R2241 Localized swelling, mass and lump, right lower limb: Secondary | ICD-10-CM | POA: Diagnosis not present

## 2024-01-08 DIAGNOSIS — K4090 Unilateral inguinal hernia, without obstruction or gangrene, not specified as recurrent: Secondary | ICD-10-CM | POA: Diagnosis not present

## 2024-01-08 DIAGNOSIS — I2510 Atherosclerotic heart disease of native coronary artery without angina pectoris: Secondary | ICD-10-CM | POA: Diagnosis not present

## 2024-01-08 DIAGNOSIS — E782 Mixed hyperlipidemia: Secondary | ICD-10-CM | POA: Diagnosis not present

## 2024-01-08 DIAGNOSIS — I10 Essential (primary) hypertension: Secondary | ICD-10-CM | POA: Diagnosis not present

## 2024-01-08 DIAGNOSIS — J439 Emphysema, unspecified: Secondary | ICD-10-CM | POA: Diagnosis not present

## 2024-01-08 DIAGNOSIS — R809 Proteinuria, unspecified: Secondary | ICD-10-CM | POA: Diagnosis not present

## 2024-01-22 DIAGNOSIS — Z135 Encounter for screening for eye and ear disorders: Secondary | ICD-10-CM | POA: Diagnosis not present

## 2024-01-22 DIAGNOSIS — H5203 Hypermetropia, bilateral: Secondary | ICD-10-CM | POA: Diagnosis not present

## 2024-02-28 DIAGNOSIS — M4722 Other spondylosis with radiculopathy, cervical region: Secondary | ICD-10-CM | POA: Diagnosis not present

## 2024-02-28 DIAGNOSIS — F17210 Nicotine dependence, cigarettes, uncomplicated: Secondary | ICD-10-CM | POA: Diagnosis not present

## 2024-02-28 DIAGNOSIS — R200 Anesthesia of skin: Secondary | ICD-10-CM | POA: Diagnosis not present

## 2024-02-28 DIAGNOSIS — R002 Palpitations: Secondary | ICD-10-CM | POA: Diagnosis not present

## 2024-02-28 DIAGNOSIS — M4312 Spondylolisthesis, cervical region: Secondary | ICD-10-CM | POA: Diagnosis not present

## 2024-02-28 DIAGNOSIS — R202 Paresthesia of skin: Secondary | ICD-10-CM | POA: Diagnosis not present

## 2024-02-28 DIAGNOSIS — M542 Cervicalgia: Secondary | ICD-10-CM | POA: Diagnosis not present

## 2024-02-28 DIAGNOSIS — M4802 Spinal stenosis, cervical region: Secondary | ICD-10-CM | POA: Diagnosis not present

## 2024-03-04 DIAGNOSIS — Z7689 Persons encountering health services in other specified circumstances: Secondary | ICD-10-CM | POA: Diagnosis not present

## 2024-03-04 DIAGNOSIS — Z79899 Other long term (current) drug therapy: Secondary | ICD-10-CM | POA: Diagnosis not present

## 2024-03-04 DIAGNOSIS — M542 Cervicalgia: Secondary | ICD-10-CM | POA: Diagnosis not present

## 2024-03-19 DIAGNOSIS — M4802 Spinal stenosis, cervical region: Secondary | ICD-10-CM | POA: Diagnosis not present

## 2024-03-19 DIAGNOSIS — M47812 Spondylosis without myelopathy or radiculopathy, cervical region: Secondary | ICD-10-CM | POA: Diagnosis not present

## 2024-03-19 DIAGNOSIS — M5412 Radiculopathy, cervical region: Secondary | ICD-10-CM | POA: Diagnosis not present

## 2024-03-24 ENCOUNTER — Telehealth: Payer: Self-pay

## 2024-03-24 NOTE — Progress Notes (Unsigned)
   03/24/2024  Patient ID: Kristopher Gilmore, male   DOB: Apr 18, 1956, 68 y.o.   MRN: [id number]  Patient appeared on insurance report for not passing the quality metrics in 2024:  Medication Adherence for Diabetes (MAD) Medication Adherence for Hypertension [facility])   Outreach to the patient was Successful. He was not able to answer my questions today but agreed to call me back tomorrow to discuss medications.  Meds Tracking:  -Atorvastatin 10 mg - Last fill 90DS 03/17/24, LDL 58 on 12/23/23. Does not qualify for metric yet, next fill due 06/15/24.  -Glipizide ER 10 mg - Last fill 90DS 06/13/23, A1C 7.0 on 12/23/23. No fills this year, unclear if he is still taking this but would be overdue.  -Ozempic 0.25 mg - Last fill 56DS on 07/23/23. No fills this year, unclear if he is still taking but would also be overdue.  -Lisinopril 40 mg - Last fill 90DS on 06/07/23, BP 128/80 on 03/04/24. No fills this year, unclear if he is still taking but likely overdue.  Plan: Did not receive call back on 03/24/24, tried again on 4/9 and patient was still out of town. Agreed to a call tomorrow anytime after 8 am

## 2024-03-30 ENCOUNTER — Telehealth: Payer: Self-pay

## 2024-03-30 NOTE — Progress Notes (Signed)
   03/30/2024  Patient ID: Kristopher Gilmore, male   DOB: 05-Mar-1956, 68 y.o.   MRN: [id number]  Patient appeared on insurance report for not passing the quality metrics in 2024:  Medication Adherence for Diabetes (MAD) Medication Adherence for Hypertension [facility])   Outreach to the patient was Successful.   Meds Tracking:  -Atorvastatin 10 mg - Last fill 90DS 03/17/24, LDL 58 on 12/23/23. Does not qualify for metric yet, next fill due 06/15/24.  -Glipizide ER 10 mg - Last fill 90DS 06/13/23, A1C 7.0 on 12/23/23. No fills this year, confirmed he is still taking this and has about half a bottle left. -Ozempic 0.25 mg - Last fill 56DS on 07/23/23. No fills this year, he said he stopped taking this due to cost last year. Has had A1C since stopping and diabetes is currently controlled.   -Lisinopril 40 mg - Last fill 90DS on 06/07/23, BP 128/80 on 03/04/24. No fills this year, confirmed he is taking this and has about 15 tablets left.  Plan: Diabetes has been fairly well controlled without Ozempic, he was only taking 0.25 mg before stopping. Will review labs before follow up in may and work with PCP if additional therapy is needed. Patient confirmed he was still taking lisinopril and had about 2 weeks left. Will review fill history in 2 weeks and reach out if no fills occur. Still has one refill remaining at the pharmacy but will need additional in June.   Flint Hummer, PharmD

## 2024-04-27 ENCOUNTER — Telehealth: Payer: Self-pay

## 2024-04-27 NOTE — Progress Notes (Signed)
   04/27/2024  Patient ID: Kristopher Gilmore, male   DOB: 25-Jun-1956, 68 y.o.   MRN: [id number]  Medication Adherence:  Still no fills for glipizide or lisinopril yet this year, patient had adequate supply of both last time we spoke a month ago. He has follow up with PCP on 04/28/24 with labs. Chronic conditions were well controlled in January, will review labs/vitals and coordinate with PCP to ensure additional refills are prescribed.  Flint Hummer, PharmD

## 2024-05-07 DIAGNOSIS — E782 Mixed hyperlipidemia: Secondary | ICD-10-CM | POA: Diagnosis not present

## 2024-05-07 DIAGNOSIS — E1165 Type 2 diabetes mellitus with hyperglycemia: Secondary | ICD-10-CM | POA: Diagnosis not present

## 2024-05-07 DIAGNOSIS — I10 Essential (primary) hypertension: Secondary | ICD-10-CM | POA: Diagnosis not present

## 2024-05-26 DIAGNOSIS — E1129 Type 2 diabetes mellitus with other diabetic kidney complication: Secondary | ICD-10-CM | POA: Diagnosis not present

## 2024-05-26 DIAGNOSIS — K4090 Unilateral inguinal hernia, without obstruction or gangrene, not specified as recurrent: Secondary | ICD-10-CM | POA: Diagnosis not present

## 2024-05-26 DIAGNOSIS — I10 Essential (primary) hypertension: Secondary | ICD-10-CM | POA: Diagnosis not present

## 2024-05-26 DIAGNOSIS — D649 Anemia, unspecified: Secondary | ICD-10-CM | POA: Diagnosis not present

## 2024-05-26 DIAGNOSIS — N401 Enlarged prostate with lower urinary tract symptoms: Secondary | ICD-10-CM | POA: Diagnosis not present

## 2024-05-26 DIAGNOSIS — J439 Emphysema, unspecified: Secondary | ICD-10-CM | POA: Diagnosis not present

## 2024-05-26 DIAGNOSIS — E782 Mixed hyperlipidemia: Secondary | ICD-10-CM | POA: Diagnosis not present

## 2024-05-26 DIAGNOSIS — G250 Essential tremor: Secondary | ICD-10-CM | POA: Diagnosis not present

## 2024-05-26 DIAGNOSIS — I2510 Atherosclerotic heart disease of native coronary artery without angina pectoris: Secondary | ICD-10-CM | POA: Diagnosis not present

## 2024-05-26 DIAGNOSIS — R809 Proteinuria, unspecified: Secondary | ICD-10-CM | POA: Diagnosis not present

## 2024-05-26 DIAGNOSIS — K449 Diaphragmatic hernia without obstruction or gangrene: Secondary | ICD-10-CM | POA: Diagnosis not present

## 2024-06-03 ENCOUNTER — Telehealth: Payer: Self-pay

## 2024-06-04 NOTE — Telephone Encounter (Signed)
 Up to date on meds, will not push glipizide fills since he has oversupply and hasn't filled yet this year. Next review in July

## 2024-06-11 ENCOUNTER — Encounter (INDEPENDENT_AMBULATORY_CARE_PROVIDER_SITE_OTHER): Payer: Self-pay | Admitting: *Deleted

## 2024-06-18 ENCOUNTER — Telehealth: Payer: Self-pay

## 2024-06-18 NOTE — Telephone Encounter (Signed)
 Outreach unsuccessful, up to date on meds he has already filled this year. Still no DM fills. Will continue to try to outreach patient to discuss DM management

## 2024-07-01 ENCOUNTER — Telehealth: Payer: Self-pay

## 2024-07-01 NOTE — Telephone Encounter (Signed)
 Unsuccessful outreach, will try again in a couple weeks. Does not qualify for any adherence metrics due to lack of fills

## 2024-07-06 ENCOUNTER — Telehealth: Payer: Self-pay

## 2024-07-06 NOTE — Telephone Encounter (Signed)
 Unsuccessful outreach, will attempt one more time later this week.

## 2024-07-14 ENCOUNTER — Telehealth: Payer: Self-pay

## 2024-07-14 NOTE — Telephone Encounter (Signed)
 Up to date on meds, next review in November

## 2024-09-01 DIAGNOSIS — I10 Essential (primary) hypertension: Secondary | ICD-10-CM | POA: Diagnosis not present

## 2024-09-01 DIAGNOSIS — E1165 Type 2 diabetes mellitus with hyperglycemia: Secondary | ICD-10-CM | POA: Diagnosis not present

## 2024-09-07 ENCOUNTER — Other Ambulatory Visit (HOSPITAL_COMMUNITY): Payer: Self-pay | Admitting: Nurse Practitioner

## 2024-09-07 DIAGNOSIS — D649 Anemia, unspecified: Secondary | ICD-10-CM | POA: Diagnosis not present

## 2024-09-07 DIAGNOSIS — E1129 Type 2 diabetes mellitus with other diabetic kidney complication: Secondary | ICD-10-CM | POA: Diagnosis not present

## 2024-09-07 DIAGNOSIS — R059 Cough, unspecified: Secondary | ICD-10-CM

## 2024-09-07 DIAGNOSIS — K449 Diaphragmatic hernia without obstruction or gangrene: Secondary | ICD-10-CM | POA: Diagnosis not present

## 2024-09-07 DIAGNOSIS — J439 Emphysema, unspecified: Secondary | ICD-10-CM | POA: Diagnosis not present

## 2024-09-07 DIAGNOSIS — R809 Proteinuria, unspecified: Secondary | ICD-10-CM | POA: Diagnosis not present

## 2024-09-07 DIAGNOSIS — G250 Essential tremor: Secondary | ICD-10-CM | POA: Diagnosis not present

## 2024-09-07 DIAGNOSIS — J441 Chronic obstructive pulmonary disease with (acute) exacerbation: Secondary | ICD-10-CM | POA: Diagnosis not present

## 2024-09-07 DIAGNOSIS — N401 Enlarged prostate with lower urinary tract symptoms: Secondary | ICD-10-CM | POA: Diagnosis not present

## 2024-09-07 DIAGNOSIS — K4090 Unilateral inguinal hernia, without obstruction or gangrene, not specified as recurrent: Secondary | ICD-10-CM | POA: Diagnosis not present

## 2024-09-07 DIAGNOSIS — I2510 Atherosclerotic heart disease of native coronary artery without angina pectoris: Secondary | ICD-10-CM | POA: Diagnosis not present

## 2024-09-07 DIAGNOSIS — E782 Mixed hyperlipidemia: Secondary | ICD-10-CM | POA: Diagnosis not present

## 2024-09-07 DIAGNOSIS — I10 Essential (primary) hypertension: Secondary | ICD-10-CM | POA: Diagnosis not present

## 2024-11-19 ENCOUNTER — Encounter: Payer: Self-pay | Admitting: *Deleted

## 2024-11-19 NOTE — Progress Notes (Signed)
 Kristopher Gilmore                                          MRN: [id number]   11/19/2024   The [facility] Quality Team Specialist reviewed this patient medical record for the purposes of chart review for care gap closure. The following were reviewed: chart review for care gap closure-controlling blood pressure and glycemic status assessment.    [facility] Quality Team

## 2024-12-14 ENCOUNTER — Encounter (INDEPENDENT_AMBULATORY_CARE_PROVIDER_SITE_OTHER): Payer: Self-pay | Admitting: *Deleted

## 2024-12-29 NOTE — Progress Notes (Signed)
 Kristopher Gilmore                                          MRN: [id number]   12/29/2024   The [facility] Quality Team Specialist reviewed this patient medical record for the purposes of chart review for care gap closure. The following were reviewed: chart review for care gap closure-controlling blood pressure.    [facility] Quality Team
